# Patient Record
Sex: Female | Born: 1970 | Race: White | Hispanic: No | Smoking: Never smoker
Health system: Southern US, Community
[De-identification: ages and names within clinical notes are randomized; demographics above are authoritative.]

## PROBLEM LIST (undated history)

## (undated) DIAGNOSIS — N200 Calculus of kidney: Secondary | ICD-10-CM

---

## 1998-03-11 ENCOUNTER — Inpatient Hospital Stay (HOSPITAL_COMMUNITY): Admission: AD | Admit: 1998-03-11 | Discharge: 1998-03-11 | Payer: Self-pay | Admitting: *Deleted

## 1998-04-02 ENCOUNTER — Inpatient Hospital Stay (HOSPITAL_COMMUNITY): Admission: AD | Admit: 1998-04-02 | Discharge: 1998-04-02 | Payer: Self-pay | Admitting: Obstetrics and Gynecology

## 1998-04-09 ENCOUNTER — Encounter: Admission: RE | Admit: 1998-04-09 | Discharge: 1998-07-08 | Payer: Self-pay | Admitting: Family Medicine

## 1998-04-13 ENCOUNTER — Ambulatory Visit (HOSPITAL_COMMUNITY): Admission: RE | Admit: 1998-04-13 | Discharge: 1998-04-13 | Payer: Self-pay | Admitting: Obstetrics and Gynecology

## 1998-04-18 ENCOUNTER — Inpatient Hospital Stay (HOSPITAL_COMMUNITY): Admission: AD | Admit: 1998-04-18 | Discharge: 1998-04-18 | Payer: Self-pay | Admitting: Obstetrics and Gynecology

## 1998-04-19 ENCOUNTER — Inpatient Hospital Stay (HOSPITAL_COMMUNITY): Admission: AD | Admit: 1998-04-19 | Discharge: 1998-04-22 | Payer: Self-pay | Admitting: Obstetrics and Gynecology

## 1998-04-23 ENCOUNTER — Observation Stay (HOSPITAL_COMMUNITY): Admission: AD | Admit: 1998-04-23 | Discharge: 1998-04-24 | Payer: Self-pay | Admitting: Obstetrics and Gynecology

## 1998-05-02 ENCOUNTER — Inpatient Hospital Stay (HOSPITAL_COMMUNITY): Admission: AD | Admit: 1998-05-02 | Discharge: 1998-05-02 | Payer: Self-pay | Admitting: Obstetrics and Gynecology

## 1998-05-03 ENCOUNTER — Ambulatory Visit (HOSPITAL_COMMUNITY): Admission: RE | Admit: 1998-05-03 | Discharge: 1998-05-03 | Payer: Self-pay | Admitting: Obstetrics and Gynecology

## 1998-05-06 ENCOUNTER — Inpatient Hospital Stay (HOSPITAL_COMMUNITY): Admission: AD | Admit: 1998-05-06 | Discharge: 1998-05-09 | Payer: Self-pay | Admitting: Obstetrics and Gynecology

## 1998-05-11 ENCOUNTER — Inpatient Hospital Stay (HOSPITAL_COMMUNITY): Admission: AD | Admit: 1998-05-11 | Discharge: 1998-05-11 | Payer: Self-pay | Admitting: Obstetrics and Gynecology

## 1998-06-13 ENCOUNTER — Other Ambulatory Visit: Admission: RE | Admit: 1998-06-13 | Discharge: 1998-06-13 | Payer: Self-pay | Admitting: Obstetrics and Gynecology

## 1998-07-16 ENCOUNTER — Inpatient Hospital Stay (HOSPITAL_COMMUNITY): Admission: RE | Admit: 1998-07-16 | Discharge: 1998-07-20 | Payer: Self-pay | Admitting: Obstetrics and Gynecology

## 1998-07-20 ENCOUNTER — Inpatient Hospital Stay (HOSPITAL_COMMUNITY): Admission: AD | Admit: 1998-07-20 | Discharge: 1998-07-20 | Payer: Self-pay | Admitting: Obstetrics and Gynecology

## 1999-09-12 ENCOUNTER — Other Ambulatory Visit: Admission: RE | Admit: 1999-09-12 | Discharge: 1999-09-12 | Payer: Self-pay | Admitting: Family Medicine

## 2000-12-18 ENCOUNTER — Ambulatory Visit (HOSPITAL_COMMUNITY): Admission: RE | Admit: 2000-12-18 | Discharge: 2000-12-18 | Payer: Self-pay | Admitting: Gastroenterology

## 2001-12-22 HISTORY — PX: ABDOMINAL HYSTERECTOMY: SHX81

## 2002-09-01 ENCOUNTER — Encounter: Admission: RE | Admit: 2002-09-01 | Discharge: 2002-11-30 | Payer: Self-pay | Admitting: Family Medicine

## 2002-09-29 ENCOUNTER — Encounter: Admission: RE | Admit: 2002-09-29 | Discharge: 2002-09-29 | Payer: Self-pay | Admitting: Neurology

## 2002-09-29 ENCOUNTER — Encounter: Payer: Self-pay | Admitting: Neurology

## 2003-02-23 ENCOUNTER — Emergency Department (HOSPITAL_COMMUNITY): Admission: EM | Admit: 2003-02-23 | Discharge: 2003-02-23 | Payer: Self-pay | Admitting: Emergency Medicine

## 2003-02-23 ENCOUNTER — Encounter: Payer: Self-pay | Admitting: Emergency Medicine

## 2004-03-26 ENCOUNTER — Other Ambulatory Visit: Admission: RE | Admit: 2004-03-26 | Discharge: 2004-03-26 | Payer: Self-pay | Admitting: Family Medicine

## 2007-08-10 ENCOUNTER — Emergency Department (HOSPITAL_COMMUNITY): Admission: EM | Admit: 2007-08-10 | Discharge: 2007-08-11 | Payer: Self-pay | Admitting: Emergency Medicine

## 2010-08-24 ENCOUNTER — Emergency Department (HOSPITAL_COMMUNITY): Admission: EM | Admit: 2010-08-24 | Discharge: 2010-08-24 | Payer: Self-pay | Admitting: Emergency Medicine

## 2011-03-06 LAB — POCT I-STAT, CHEM 8
BUN: 12 mg/dL (ref 6–23)
Calcium, Ion: 1.13 mmol/L (ref 1.12–1.32)
Chloride: 106 mEq/L (ref 96–112)
Glucose, Bld: 115 mg/dL — ABNORMAL HIGH (ref 70–99)
TCO2: 25 mmol/L (ref 0–100)

## 2011-03-06 LAB — POCT PREGNANCY, URINE: Preg Test, Ur: NEGATIVE

## 2011-05-09 NOTE — Procedures (Signed)
[facility]  Patient:    Nichole Figueroa, Nichole Figueroa                       MRN: [id number] Proc. Date: 12/18/00 Adm. Date:  [id number] Attending:  Orland Mustard CC:         Meredith Staggers, M.D.   Procedure Report  DATE OF BIRTH:  July 25, 1971  PROCEDURE:  Esophagogastroduodenoscopy.  MEDICATIONS:  Cetacaine spray, fentanyl 60 mcg, Versed 5 mg IV.  INDICATIONS:  Patient has a history of esophageal reflux.  Has been on a multitude of medications including proton-pump inhibitors and H2 blockers, which have failed to help.  For this reason, an endoscopy is performed.  DESCRIPTION OF PROCEDURE:  The procedure being explained to the patient, consent obtained.  The patient in left lateral decubitus position.  The Olympus endoscope was inserted blindly into the esophagus and advanced under visualization. The distal esophagus was reached.  There was what appeared to be a Schatzki ring and inflamed distal esophagus with a small ulceration at the tip of the ring, widely patent GE junction with free reflux.  There was a 3-4 cm hiatal hernia.  The stomach was entered, pylorus identified and passed. The duodenum, including the bulb and second portion were seen well and was unremarkable.  Scope withdrawn back into the stomach.  Pyloric channel was normal.  The antrum and body were seen well and were normal.  Fundus and cardia seen on retroflexed view and were normal.  The scope was withdrawn. The patient tolerated procedure well and was maintained on low-flow oxygen and pulse oximeter throughout the procedure with no obvious problem.  ASSESSMENT:  Gastroesophageal reflux disease with small ulceration.  PLAN:  Will give a combination of Nexium , Reglan and will give reflux instructions.  Will see back in the office in 2-3 weeks. DD:  12/18/00 TD:  12/18/00 Job: [id number]

## 2011-10-03 LAB — CBC
HCT: 36.7
Hemoglobin: 12.3
MCHC: 33.6
MCV: 85
RBC: 4.32
WBC: 9.2

## 2011-10-03 LAB — DIFFERENTIAL
Basophils Relative: 1
Eosinophils Absolute: 0.1
Eosinophils Relative: 1
Lymphs Abs: 2.4
Monocytes Absolute: 0.6
Monocytes Relative: 7
Neutrophils Relative %: 65

## 2011-10-03 LAB — I-STAT 8, (EC8 V) (CONVERTED LAB)
Acid-base deficit: 2
BUN: 10
Chloride: 106
Glucose, Bld: 91
Potassium: 3.7
pCO2, Ven: 44.2 — ABNORMAL LOW
pH, Ven: 7.342 — ABNORMAL HIGH

## 2011-10-03 LAB — POCT I-STAT CREATININE: Creatinine, Ser: 1

## 2016-07-25 ENCOUNTER — Inpatient Hospital Stay (HOSPITAL_COMMUNITY)
Admission: EM | Admit: 2016-07-25 | Discharge: 2016-07-29 | DRG: 700 | Disposition: A | Discharge status: Home / Self Care | Payer: Self-pay | Attending: Urology | Admitting: Urology

## 2016-07-25 ENCOUNTER — Emergency Department (HOSPITAL_COMMUNITY): Payer: Self-pay

## 2016-07-25 ENCOUNTER — Encounter (HOSPITAL_COMMUNITY): Payer: Self-pay | Admitting: Emergency Medicine

## 2016-07-25 DIAGNOSIS — Z87442 Personal history of urinary calculi: Secondary | ICD-10-CM

## 2016-07-25 DIAGNOSIS — R3129 Other microscopic hematuria: Secondary | ICD-10-CM | POA: Diagnosis present

## 2016-07-25 DIAGNOSIS — N390 Urinary tract infection, site not specified: Secondary | ICD-10-CM

## 2016-07-25 DIAGNOSIS — Z885 Allergy status to narcotic agent status: Secondary | ICD-10-CM

## 2016-07-25 DIAGNOSIS — N2889 Other specified disorders of kidney and ureter: Principal | ICD-10-CM | POA: Diagnosis present

## 2016-07-25 DIAGNOSIS — N281 Cyst of kidney, acquired: Secondary | ICD-10-CM | POA: Diagnosis present

## 2016-07-25 DIAGNOSIS — R109 Unspecified abdominal pain: Secondary | ICD-10-CM

## 2016-07-25 HISTORY — DX: Calculus of kidney: N20.0

## 2016-07-25 LAB — COMPREHENSIVE METABOLIC PANEL
ALK PHOS: 93 U/L (ref 38–126)
ALT: 14 U/L (ref 14–54)
AST: 18 U/L (ref 15–41)
Albumin: 4.1 g/dL (ref 3.5–5.0)
Anion gap: 8 (ref 5–15)
BUN: 16 mg/dL (ref 6–20)
CHLORIDE: 107 mmol/L (ref 101–111)
CO2: 24 mmol/L (ref 22–32)
CREATININE: 0.95 mg/dL (ref 0.44–1.00)
Calcium: 9.2 mg/dL (ref 8.9–10.3)
GFR calc Af Amer: >60 mL/min (ref >60)
GFR calc non Af Amer: >60 mL/min (ref >60)
GLUCOSE: 125 mg/dL — AB (ref 65–99)
Potassium: 3.7 mmol/L (ref 3.5–5.1)
SODIUM: 139 mmol/L (ref 135–145)
Total Bilirubin: 0.7 mg/dL (ref 0.3–1.2)
Total Protein: 8.4 g/dL — ABNORMAL HIGH (ref 6.5–8.1)

## 2016-07-25 LAB — CBC
HCT: 33.6 % — ABNORMAL LOW (ref 36.0–46.0)
Hemoglobin: 10.7 g/dL — ABNORMAL LOW (ref 12.0–15.0)
MCH: 26.3 pg (ref 26.0–34.0)
MCHC: 31.8 g/dL (ref 30.0–36.0)
MCV: 82.6 fL (ref 78.0–100.0)
PLATELETS: 342 10*3/uL (ref 150–400)
RBC: 4.07 MIL/uL (ref 3.87–5.11)
RDW: 15 % (ref 11.5–15.5)
WBC: 9.2 10*3/uL (ref 4.0–10.5)

## 2016-07-25 LAB — URINALYSIS, ROUTINE W REFLEX MICROSCOPIC
BILIRUBIN URINE: NEGATIVE
GLUCOSE, UA: NEGATIVE mg/dL
KETONES UR: NEGATIVE mg/dL
Leukocytes, UA: NEGATIVE
Nitrite: NEGATIVE
PROTEIN: 30 mg/dL — AB
Specific Gravity, Urine: 1.024 (ref 1.005–1.030)
pH: 6 (ref 5.0–8.0)

## 2016-07-25 LAB — HEMOGLOBIN AND HEMATOCRIT, BLOOD
HCT: 32.2 % — ABNORMAL LOW (ref 36.0–46.0)
HEMOGLOBIN: 10.2 g/dL — AB (ref 12.0–15.0)

## 2016-07-25 LAB — LIPASE, BLOOD: LIPASE: 24 U/L (ref 11–51)

## 2016-07-25 LAB — URINE MICROSCOPIC-ADD ON

## 2016-07-25 LAB — I-STAT BETA HCG BLOOD, ED (MC, WL, AP ONLY): I-stat hCG, quantitative: <5 m[IU]/mL (ref <5)

## 2016-07-25 MED ORDER — HYDROMORPHONE HCL 2 MG PO TABS: 1.0000 mg | ORAL_TABLET | ORAL | 0 refills | Status: DC | PRN

## 2016-07-25 MED ORDER — DEXTROSE 5 % IV SOLN
1.0000 g | Freq: Once | INTRAVENOUS | Status: AC
  Administered 2016-07-25: 1 g via INTRAVENOUS
  Filled 2016-07-25: qty 10

## 2016-07-25 MED ORDER — ONDANSETRON HCL 4 MG/2ML IJ SOLN
4.0000 mg | INTRAMUSCULAR | Status: DC | PRN
  Administered 2016-07-25 – 2016-07-26 (×3): 4 mg via INTRAVENOUS
  Filled 2016-07-25 (×3): qty 2

## 2016-07-25 MED ORDER — HYDROMORPHONE HCL 1 MG/ML IJ SOLN
1.0000 mg | Freq: Once | INTRAMUSCULAR | Status: AC
  Administered 2016-07-25: 1 mg via INTRAVENOUS
  Filled 2016-07-25: qty 1

## 2016-07-25 MED ORDER — IOPAMIDOL (ISOVUE-300) INJECTION 61%
100.0000 mL | Freq: Once | INTRAVENOUS | Status: AC | PRN
  Administered 2016-07-25: 100 mL via INTRAVENOUS

## 2016-07-25 MED ORDER — OXYCODONE-ACETAMINOPHEN 5-325 MG PO TABS: 1.0000 | ORAL_TABLET | ORAL | Status: DC | PRN

## 2016-07-25 MED ORDER — HYDROMORPHONE HCL 1 MG/ML IJ SOLN
0.5000 mg | INTRAMUSCULAR | Status: DC | PRN
  Administered 2016-07-25 – 2016-07-28 (×17): 1 mg via INTRAVENOUS
  Filled 2016-07-25 (×17): qty 1

## 2016-07-25 MED ORDER — DIPHENHYDRAMINE HCL 25 MG PO CAPS
25.0000 mg | ORAL_CAPSULE | Freq: Once | ORAL | Status: AC
  Administered 2016-07-25: 25 mg via ORAL
  Filled 2016-07-25: qty 1

## 2016-07-25 MED ORDER — MORPHINE SULFATE (PF) 4 MG/ML IV SOLN
6.0000 mg | Freq: Once | INTRAVENOUS | Status: AC
  Administered 2016-07-25: 6 mg via INTRAVENOUS
  Filled 2016-07-25: qty 2

## 2016-07-25 MED ORDER — HYDROMORPHONE HCL 1 MG/ML IJ SOLN: 1.0000 mg | INTRAMUSCULAR | Status: DC | PRN

## 2016-07-25 MED ORDER — ONDANSETRON 4 MG PO TBDP: 4.0000 mg | ORAL_TABLET | Freq: Three times a day (TID) | ORAL | 0 refills | Status: DC | PRN

## 2016-07-25 MED ORDER — CEPHALEXIN 500 MG PO CAPS: 500.0000 mg | ORAL_CAPSULE | Freq: Two times a day (BID) | ORAL | 0 refills | Status: DC

## 2016-07-25 MED ORDER — MORPHINE SULFATE (PF) 4 MG/ML IV SOLN
4.0000 mg | Freq: Once | INTRAVENOUS | Status: AC
  Administered 2016-07-25: 4 mg via INTRAVENOUS
  Filled 2016-07-25: qty 1

## 2016-07-25 MED ORDER — KCL IN DEXTROSE-NACL 20-5-0.45 MEQ/L-%-% IV SOLN
INTRAVENOUS | Status: DC
  Administered 2016-07-25 – 2016-07-28 (×5): via INTRAVENOUS
  Filled 2016-07-25 (×9): qty 1000

## 2016-07-25 MED ORDER — ONDANSETRON HCL 4 MG/2ML IJ SOLN: 4.0000 mg | Freq: Three times a day (TID) | INTRAMUSCULAR | Status: DC | PRN

## 2016-07-25 MED ORDER — ONDANSETRON HCL 4 MG/2ML IJ SOLN
4.0000 mg | Freq: Once | INTRAMUSCULAR | Status: AC
Start: 2016-07-25 — End: 2016-07-25
  Administered 2016-07-25: 4 mg via INTRAVENOUS
  Filled 2016-07-25: qty 2

## 2016-07-25 MED ORDER — ONDANSETRON HCL 4 MG/2ML IJ SOLN
4.0000 mg | Freq: Once | INTRAMUSCULAR | Status: AC
  Administered 2016-07-25: 4 mg via INTRAVENOUS
  Filled 2016-07-25: qty 2

## 2016-07-25 NOTE — ED Triage Notes (Signed)
Pt reports L flank pain since this am. No dysuria or blood in urine. Hx of kidney stones, feels like same. No known injuries. Some emesis. No diarrhea.

## 2016-07-25 NOTE — ED Provider Notes (Signed)
[facility] DEPT Provider Note   CSN: [id number] Arrival date & time: 07/25/16  1044  First Provider Contact:  First MD Initiated Contact with Patient 07/25/16 1105        History   Chief Complaint Chief Complaint  Patient presents with  . Flank Pain    HPI Nichole Figueroa is a 45 y.o. female.  The history is provided by the patient and medical records.  Flank Pain    45 year old female with history of kidney stones, presenting to the ED for sudden onset left flank pain that began at 4 AM this morning. States pain began in her left flank and is now localized to her left groin. States she has been writhing in pain since onset. States pain does come in waves, at times more severe than others but never fully resolves. She does report some nausea and vomiting as well. She has had some difficulty urinating but denies any noted hematuria. No fever or chills. Does report history of kidney stones in the past with similar symptoms. Her prior stones were passed spontaneously. She's not had any other abdominal surgeries.  She did take goody powder this morning without any relief.  Past Medical History:  Diagnosis Date  . Kidney stone     There are no active problems to display for this patient.   History reviewed. No pertinent surgical history.  OB History    No data available       Home Medications    Prior to Admission medications   Not on File    Family History History reviewed. No pertinent family history.  Social History Social History  Substance Use Topics  . Smoking status: Never Smoker  . Smokeless tobacco: Never Used  . Alcohol use No     Allergies   Codeine; Hydrocodone; and Tramadol   Review of Systems Review of Systems  Genitourinary: Positive for flank pain.     Physical Exam Updated Vital Signs BP 137/72   Pulse 70   Temp 98.2 F (36.8 C) (Oral)   Resp 16   SpO2 98%   Physical Exam  Constitutional: She is oriented to person, place,  and time. She appears well-developed and well-nourished.  Appears uncomfortable, writing in bed  HENT:  Head: Normocephalic and atraumatic.  Mouth/Throat: Oropharynx is clear and moist.  Eyes: Conjunctivae and EOM are normal. Pupils are equal, round, and reactive to light.  Neck: Normal range of motion.  Cardiovascular: Normal rate, regular rhythm and normal heart sounds.   Pulmonary/Chest: Effort normal and breath sounds normal.  Abdominal: Soft. Bowel sounds are normal. There is CVA tenderness (left).    Left CVA tenderness with radiation into left groin  Musculoskeletal: Normal range of motion.  Neurological: She is alert and oriented to person, place, and time.  Skin: Skin is warm and dry.  Psychiatric: She has a normal mood and affect.  Nursing note and vitals reviewed.    ED Treatments / Results  Labs (all labs ordered are listed, but only abnormal results are displayed) Labs Reviewed  COMPREHENSIVE METABOLIC PANEL - Abnormal; Notable for the following:       Result Value   Glucose, Bld 125 (*)    Total Protein 8.4 (*)    All other components within normal limits  CBC - Abnormal; Notable for the following:    Hemoglobin 10.7 (*)    HCT 33.6 (*)    All other components within normal limits  URINALYSIS, ROUTINE W REFLEX MICROSCOPIC (NOT AT  [facility]) - Abnormal; Notable for the following:    APPearance CLOUDY (*)    Hgb urine dipstick MODERATE (*)    Protein, ur 30 (*)    All other components within normal limits  URINE MICROSCOPIC-ADD ON - Abnormal; Notable for the following:    Squamous Epithelial / LPF 0-5 (*)    Bacteria, UA MANY (*)    All other components within normal limits  URINE CULTURE  LIPASE, BLOOD  I-STAT BETA HCG BLOOD, ED ([facility], [facility], [facility] ONLY)    EKG  EKG Interpretation None       Radiology Ct Abdomen Pelvis W Contrast  Addendum Date: 07/25/2016   ADDENDUM REPORT: 07/25/2016 15:40 ADDENDUM: The changes in the liver could also represent a perfusion  abnormality. Electronically Signed   By: Inez Catalina M.D.   On: 07/25/2016 15:40   Result Date: 07/25/2016 CLINICAL DATA:  Left renal hemorrhage EXAM: CT ABDOMEN AND PELVIS WITH CONTRAST TECHNIQUE: Multidetector CT imaging of the abdomen and pelvis was performed using the standard protocol following bolus administration of intravenous contrast. CONTRAST:  129mL ISOVUE-300 IOPAMIDOL (ISOVUE-300) INJECTION 61% COMPARISON:  Noncontrast study from earlier in the same day FINDINGS: Lower chest: Lung bases demonstrate some mild atelectatic changes. No focal confluent infiltrate is seen. A small hiatal hernia is noted. Hepatobiliary: Gallbladder is within normal limits. The liver demonstrates multiple rounded areas of enhancement which were not well appreciated in the early arterial phase hand are not well appreciated on the delayed images. Pancreas: No mass, inflammatory changes, or other significant abnormality. Spleen: Within normal limits in size and appearance. Adrenals/Urinary Tract: The adrenal glands are within normal limits bilaterally. The right kidney is within normal limits. The left kidney again demonstrates a large masslike lesion with heterogeneous internal density. No active extravasation of contrast material is noted. Some perinephric stranding is noted inferiorly extending along the psoas muscle. The bladder is decompressed. No pelvic mass lesion is noted. The uterus has been surgically removed. Stomach/Bowel: No evidence of obstruction, inflammatory process, or abnormal fluid collections. The appendix is within normal limits. Vascular/Lymphatic: No pathologically enlarged lymph nodes. No evidence of abdominal aortic aneurysm. Reproductive: No mass or other significant abnormality. Other: None. Musculoskeletal:  No suspicious bone lesions identified. IMPRESSION: Changes are again noted in the left kidney similar to that seen on the precontrast examination. The differential is unchanged and MRI is  recommended for further evaluation. Areas of enhancement within the liver. These may represent multiple hepatic hemangiomas. This could also be evaluated on MRI. Alternatively ultrasound of the right upper quadrant may be helpful. Electronically Signed: By: Inez Catalina M.D. On: 07/25/2016 15:36   Ct Renal Stone Study  Result Date: 07/25/2016 CLINICAL DATA:  Left flank pain EXAM: CT ABDOMEN AND PELVIS WITHOUT CONTRAST TECHNIQUE: Multidetector CT imaging of the abdomen and pelvis was performed following the standard protocol without IV contrast. COMPARISON:  None. FINDINGS: Lower chest: Dependent changes are noted within both lung bases. There is no pleural effusion identified. Hepatobiliary: No suspicious liver abnormality. The gallbladder appears normal. Pancreas: Normal appearance of the pancreas. Spleen: The visualized portions of the spleen are normal. Adrenals/Urinary Tract: Normal appearance of the adrenal glands. The right kidney appears normal. There is a large heterogeneous mass involving the mid and inferior pole the left kidney which measures 8.8 by 8.9 set by 8.0 cm peer this appears well circumscribed. She dx stranding within the perinephric fat is identified. There is also fluid extending along the retroperitoneal space adjacent to the  left psoas muscle. No left-sided hydronephrosis identified. The urinary bladder appears normal. Stomach/Bowel: Small hiatal hernia identified. The small bowel loops have a normal course and caliber. The appendix is visualized and appears normal. Normal appearance of the colon. Vascular/Lymphatic: Normal appearance of the abdominal aorta. No enlarged retroperitoneal or mesenteric adenopathy. No enlarged pelvic or inguinal lymph nodes. Reproductive: Previous hysterectomy.  No adnexal mass. Other: There is no ascites or focal fluid collections within the abdomen or pelvis. Musculoskeletal:  No suspicious bone lesions identified. IMPRESSION: 1. Large heterogeneous mass  arises from the mid and inferior pole of the left kidney. This is favored to represent hemorrhage into a pre-existing cyst or hemorrhage into a benign or malignant kidney mass. Followup imaging to ensure stability/resolution. Additionally, following resolution of acute hemorrhage a contrast enhanced MRI of the kidneys may be helpful to assess for underlying solid renal mass which may have hemorrhaged. 2. Small hiatal hernia. Electronically Signed   By: Kerby Moors M.D.   On: 07/25/2016 12:55    Procedures Procedures (including critical care time)  Medications Ordered in ED Medications  HYDROmorphone (DILAUDID) injection 1 mg (1 mg Intravenous Given 07/25/16 1147)  ondansetron (ZOFRAN) injection 4 mg (4 mg Intravenous Given 07/25/16 1147)  morphine 4 MG/ML injection 6 mg (6 mg Intravenous Given 07/25/16 1251)  morphine 4 MG/ML injection 4 mg (4 mg Intravenous Given 07/25/16 1445)  iopamidol (ISOVUE-300) 61 % injection 100 mL (100 mLs Intravenous Contrast Given 07/25/16 1504)  HYDROmorphone (DILAUDID) injection 1 mg (1 mg Intravenous Given 07/25/16 1628)  diphenhydrAMINE (BENADRYL) capsule 25 mg (25 mg Oral Given 07/25/16 1732)  ondansetron (ZOFRAN) injection 4 mg (4 mg Intravenous Given 07/25/16 1756)  cefTRIAXone (ROCEPHIN) 1 g in dextrose 5 % 50 mL IVPB (1 g Intravenous New Bag/Given 07/25/16 1842)     Initial Impression / Assessment and Plan / ED Course  I have reviewed the triage vital signs and the nursing notes.  Pertinent labs & imaging results that were available during my care of the patient were reviewed by me and considered in my medical decision making (see chart for details).  Clinical Course  Value Comment By Time  CO2: 24 (Reviewed) Larene Pickett, PA-C 08/76 7065   45 year old female here with left flank pain. On exam she does appear uncomfortable, writhing in the bed. History of kidney stones in the past with similar symptoms. She is afebrile and nontoxic. Labs obtained, overall  reassuring. Her hemoglobin is stable at 10.7.  Renal function preserved.  U/a appears infectious with many bacteria, will send for culture.  CT renal study obtained-- does reveal likely a large left hemorrhagic mass. Consult urology, Dr. Risa Grill-- he will review CT and call back.  1:39 PM Have spoken with Dr. Risa Grill again after he reviewed CT-- feels this is likely a mass that she has bled into, question benign vs malignant.  Feels this is unlikely to spread externally or become life threatening.  If remains pain controlled and hemodynamically stable, can be safely discharged home and they will follow-up in clinic on Monday.  Does recommend CT with contrast for further evaluation.  CT ordered, pain controlled currently.  5:14 PM Pain controlled, patient resting comfortably in bed.  I have discussed her imaging studies and labs with her at length several times and have answered all of her questions.  She understands the importance of close follow-up.  She also understands to return here for any new/worsening symptoms.  She has a noted allergy to codeine  so will have to d/c home with low dose dilaudid tablets.  Also given Rx zofran and keflex.  Urine culture pending.  FU with urology clinic on Monday.  Discussed plan with patient, he/she acknowledged understanding and agreed with plan of care.  Return precautions given for new or worsening symptoms.  5:20 PM After discharge patient began complaining of itching to RN.  Upon me entering room she is resting comfortably but begins scratching her abdomen when I approach her.  She has no apparent bodily rash or any signs of allergic reaction near her IV.  He oropharynx is clear.  Will give dose of benadryl.    6:12 PM Patient now vomiting.  States she does not feel comfortable going home at this time.  She has required multiple doses of pain medication here in the ED. Will consult urology again for possible observation admission.  6:34 PM Spoke with Dr. Risa Grill  again-- going into emergency surgery currently.  I have placed temp admit orders for med-surg obs bed.  He will see her when he finishes his case.  Given dose of Rocephin pending urine culture.  Final Clinical Impressions(s) / ED Diagnoses   Final diagnoses:  Renal hemorrhage, left  Left flank pain  UTI (lower urinary tract infection)    New Prescriptions Current Discharge Medication List       Larene Pickett, PA-C 07/25/16 1936    Dorie Rank, MD 07/29/16 [phone]

## 2016-07-25 NOTE — ED Notes (Signed)
Patient off the floor.

## 2016-07-25 NOTE — ED Notes (Signed)
Patient transported to CT 

## 2016-07-25 NOTE — ED Notes (Signed)
After dressing pt and assisting her to wheelchair, pt started to vomit and sts ,"I can't do this." EDP notified and is calling urology back to see if pt can be admitted for pain control and uncontrolled nausea. Pt put back into bed

## 2016-07-25 NOTE — Progress Notes (Signed)
Patient states that she gets heartburn at night. There are no orders for antacids. Urology MD on call was paged.

## 2016-07-25 NOTE — Discharge Instructions (Signed)
As we discussed, you do have a hemorrhage in the left kidney. This appears contained and does not appear to be life-threatening based on our urologists opinion. Recommend to avoid ASA, NSAIDs (aleve, motrin, goody's, etc) as these all can make bleeding worse. Take the prescribed medication as directed. Use caution when taking this, a Chem-18 drowsy/sleepy. Follow-up with urology clinic on Monday, they are expecting you. Return to the ED for new or worsening symptoms.

## 2016-07-25 NOTE — H&P (Signed)
Nichole Figueroa is an 45 y.o. female.   Chief Complaint: Left flank pain with nausea and vomiting HPI: 45 year old female with prior urologic history of nephrolithiasis and presented to the emergency room early this morning with sudden onset of significant left flank pain with radiation across her abdomen to her groin. She is also had nausea and vomiting. Given her history nephrolithiasis was suspected and a noncontrast CT was performed. Urinalysis did show microhematuria with some bacteria but no significant pyuria. Stone protocol CT was performed. A large heterogeneous mass involving the mid and inferior pole left kidney was appreciated. This mass was approximately 9 x 8 cm. The mass itself was fairly well circumscribed. This appeared consistent with acute hemorrhage into either a renal cyst or potential renal mass. The patient had no prior known history of renal cyst, angiomyolipoma or renal tumor. A definitive diagnosis could not be established. CT was repeated with IV contrast. There was continued evidence of a large mass with heterogeneous internal density. No evidence of extravasation. An obvious stone was not appreciated. Findings consistent with hemorrhage into a renal mass or underlying cyst. The patient has continued to have intermittent bouts of pain as well as nausea and emesis. She has been her all day in the ER physicians feel she does require at least overnight admission for observation for management of her pain, nausea and serial monitoring of her hemoglobin. Given the entirety of her situation I concur with this.  Both of her previous stones were passed spontaneously and the most recent was 15 years ago. She has had no recent abdominal imaging. She does admit to taking constant amounts of Goody powder for headache issues.  Past Medical History:  Diagnosis Date  . Kidney stone     Past Surgical History:  Procedure Laterality Date  . ABDOMINAL HYSTERECTOMY N/A 2003    History  reviewed. No pertinent family history. Social History:  reports that she has never smoked. She has never used smokeless tobacco. She reports that she does not drink alcohol. Her drug history is not on file.  Allergies:  Allergies  Allergen Reactions  . Codeine Nausea And Vomiting  . Hydrocodone Nausea And Vomiting  . Tramadol Hives     (Not in a hospital admission)  Results for orders placed or performed during the hospital encounter of 07/25/16 (from the past 48 hour(s))  Lipase, blood     Status: None   Collection Time: 07/25/16 11:24 AM  Result Value Ref Range   Lipase 24 11 - 51 U/L  Comprehensive metabolic panel     Status: Abnormal   Collection Time: 07/25/16 11:24 AM  Result Value Ref Range   Sodium 139 135 - 145 mmol/L   Potassium 3.7 3.5 - 5.1 mmol/L   Chloride 107 101 - 111 mmol/L   CO2 24 22 - 32 mmol/L   Glucose, Bld 125 (H) 65 - 99 mg/dL   BUN 16 6 - 20 mg/dL   Creatinine, Ser 0.95 0.44 - 1.00 mg/dL   Calcium 9.2 8.9 - 10.3 mg/dL   Total Protein 8.4 (H) 6.5 - 8.1 g/dL   Albumin 4.1 3.5 - 5.0 g/dL   AST 18 15 - 41 U/L   ALT 14 14 - 54 U/L   Alkaline Phosphatase 93 38 - 126 U/L   Total Bilirubin 0.7 0.3 - 1.2 mg/dL   GFR calc non Af Amer >60 >60 mL/min   GFR calc Af Amer >60 >60 mL/min    Comment: (NOTE) The  eGFR has been calculated using the CKD EPI equation. This calculation has not been validated in all clinical situations. eGFR's persistently <60 mL/min signify possible Chronic Kidney Disease.    Anion gap 8 5 - 15  CBC     Status: Abnormal   Collection Time: 07/25/16 11:24 AM  Result Value Ref Range   WBC 9.2 4.0 - 10.5 K/uL   RBC 4.07 3.87 - 5.11 MIL/uL   Hemoglobin 10.7 (L) 12.0 - 15.0 g/dL   HCT 33.6 (L) 36.0 - 46.0 %   MCV 82.6 78.0 - 100.0 fL   MCH 26.3 26.0 - 34.0 pg   MCHC 31.8 30.0 - 36.0 g/dL   RDW 15.0 11.5 - 15.5 %   Platelets 342 150 - 400 K/uL  Urinalysis, Routine w reflex microscopic     Status: Abnormal   Collection Time:  07/25/16 11:40 AM  Result Value Ref Range   Color, Urine YELLOW YELLOW   APPearance CLOUDY (A) CLEAR   Specific Gravity, Urine 1.024 1.005 - 1.030   pH 6.0 5.0 - 8.0   Glucose, UA NEGATIVE NEGATIVE mg/dL   Hgb urine dipstick MODERATE (A) NEGATIVE   Bilirubin Urine NEGATIVE NEGATIVE   Ketones, ur NEGATIVE NEGATIVE mg/dL   Protein, ur 30 (A) NEGATIVE mg/dL   Nitrite NEGATIVE NEGATIVE   Leukocytes, UA NEGATIVE NEGATIVE  Urine microscopic-add on     Status: Abnormal   Collection Time: 07/25/16 11:40 AM  Result Value Ref Range   Squamous Epithelial / LPF 0-5 (A) NONE SEEN   WBC, UA 0-5 0 - 5 WBC/hpf   RBC / HPF 6-30 0 - 5 RBC/hpf   Bacteria, UA MANY (A) NONE SEEN   Urine-Other MUCOUS PRESENT   I-Stat Beta hCG blood, ED ([facility], [facility], [facility] only)     Status: None   Collection Time: 07/25/16 11:53 AM  Result Value Ref Range   I-stat hCG, quantitative <5.0 <5 mIU/mL   Comment 3            Comment:   GEST. AGE      CONC.  (mIU/mL)   <=1 WEEK        5 - 50     2 WEEKS       50 - 500     3 WEEKS       100 - 10,000     4 WEEKS     1,000 - 30,000        FEMALE AND NON-PREGNANT FEMALE:     LESS THAN 5 mIU/mL    Ct Abdomen Pelvis W Contrast  Addendum Date: 07/25/2016   ADDENDUM REPORT: 07/25/2016 15:40 ADDENDUM: The changes in the liver could also represent a perfusion abnormality. Electronically Signed   By: Inez Catalina M.D.   On: 07/25/2016 15:40   Result Date: 07/25/2016 CLINICAL DATA:  Left renal hemorrhage EXAM: CT ABDOMEN AND PELVIS WITH CONTRAST TECHNIQUE: Multidetector CT imaging of the abdomen and pelvis was performed using the standard protocol following bolus administration of intravenous contrast. CONTRAST:  155m ISOVUE-300 IOPAMIDOL (ISOVUE-300) INJECTION 61% COMPARISON:  Noncontrast study from earlier in the same day FINDINGS: Lower chest: Lung bases demonstrate some mild atelectatic changes. No focal confluent infiltrate is seen. A small hiatal hernia is noted. Hepatobiliary: Gallbladder  is within normal limits. The liver demonstrates multiple rounded areas of enhancement which were not well appreciated in the early arterial phase hand are not well appreciated on the delayed images. Pancreas: No mass, inflammatory changes, or other  significant abnormality. Spleen: Within normal limits in size and appearance. Adrenals/Urinary Tract: The adrenal glands are within normal limits bilaterally. The right kidney is within normal limits. The left kidney again demonstrates a large masslike lesion with heterogeneous internal density. No active extravasation of contrast material is noted. Some perinephric stranding is noted inferiorly extending along the psoas muscle. The bladder is decompressed. No pelvic mass lesion is noted. The uterus has been surgically removed. Stomach/Bowel: No evidence of obstruction, inflammatory process, or abnormal fluid collections. The appendix is within normal limits. Vascular/Lymphatic: No pathologically enlarged lymph nodes. No evidence of abdominal aortic aneurysm. Reproductive: No mass or other significant abnormality. Other: None. Musculoskeletal:  No suspicious bone lesions identified. IMPRESSION: Changes are again noted in the left kidney similar to that seen on the precontrast examination. The differential is unchanged and MRI is recommended for further evaluation. Areas of enhancement within the liver. These may represent multiple hepatic hemangiomas. This could also be evaluated on MRI. Alternatively ultrasound of the right upper quadrant may be helpful. Electronically Signed: By: Inez Catalina M.D. On: 07/25/2016 15:36   Ct Renal Stone Study  Result Date: 07/25/2016 CLINICAL DATA:  Left flank pain EXAM: CT ABDOMEN AND PELVIS WITHOUT CONTRAST TECHNIQUE: Multidetector CT imaging of the abdomen and pelvis was performed following the standard protocol without IV contrast. COMPARISON:  None. FINDINGS: Lower chest: Dependent changes are noted within both lung bases. There  is no pleural effusion identified. Hepatobiliary: No suspicious liver abnormality. The gallbladder appears normal. Pancreas: Normal appearance of the pancreas. Spleen: The visualized portions of the spleen are normal. Adrenals/Urinary Tract: Normal appearance of the adrenal glands. The right kidney appears normal. There is a large heterogeneous mass involving the mid and inferior pole the left kidney which measures 8.8 by 8.9 set by 8.0 cm peer this appears well circumscribed. She dx stranding within the perinephric fat is identified. There is also fluid extending along the retroperitoneal space adjacent to the left psoas muscle. No left-sided hydronephrosis identified. The urinary bladder appears normal. Stomach/Bowel: Small hiatal hernia identified. The small bowel loops have a normal course and caliber. The appendix is visualized and appears normal. Normal appearance of the colon. Vascular/Lymphatic: Normal appearance of the abdominal aorta. No enlarged retroperitoneal or mesenteric adenopathy. No enlarged pelvic or inguinal lymph nodes. Reproductive: Previous hysterectomy.  No adnexal mass. Other: There is no ascites or focal fluid collections within the abdomen or pelvis. Musculoskeletal:  No suspicious bone lesions identified. IMPRESSION: 1. Large heterogeneous mass arises from the mid and inferior pole of the left kidney. This is favored to represent hemorrhage into a pre-existing cyst or hemorrhage into a benign or malignant kidney mass. Followup imaging to ensure stability/resolution. Additionally, following resolution of acute hemorrhage a contrast enhanced MRI of the kidneys may be helpful to assess for underlying solid renal mass which may have hemorrhaged. 2. Small hiatal hernia. Electronically Signed   By: Kerby Moors M.D.   On: 07/25/2016 12:55    Review of Systems - Negative except the above mentioned complaints within the history of present illness. She does specifically denied fever, gross  hematuria  Blood pressure 151/92, pulse (!) 50, temperature 98.3 F (36.8 C), temperature source Oral, resp. rate 14, height _0  (1.676 m), weight 88 kg (194 lb), SpO2 98 %. General appearance: alert, cooperative and no distress Neck: no adenopathy and no JVD Resp: clear to auscultation bilaterally Cardio: regular rate and rhythm GI: soft, non-tender; bowel sounds normal; no masses moderate left CVA  tenderness no flank hematoma/ecchymosis Extremities: extremities normal, atraumatic, no cyanosis or edema Skin: Skin color, texture, turgor normal. No rashes or lesions Neurologic: Grossly normal  Assessment/Plan Significant left flank pain. Patient has a hemorrhagic mass involving the left kidney. There is likely 2 of been an underlying renal cyst or renal mass that has subsequently hemorrhaged. The hemorrhages contained within the renal parenchyma. Etiology could include a complex cyst with subsequent hemorrhage versus angiomyolipoma with hemorrhage versus renal cell carcinoma with hemorrhage. The patient will be admitted for control of her pain nausea and vomiting. If stable and improved in the morning will be able to be discharged. She will require additional imaging preferably with abdominal MRI. Serial images may be necessary before a definitive etiology is determined. If significant evidence of ongoing bleeding occurs that interventional radiology intervention with embolization would likely be the treatment of choice.  Froylan Hobby S 07/25/2016, 7:05 PM

## 2016-07-26 LAB — BASIC METABOLIC PANEL
ANION GAP: 5 (ref 5–15)
BUN: 11 mg/dL (ref 6–20)
CHLORIDE: 104 mmol/L (ref 101–111)
CO2: 28 mmol/L (ref 22–32)
Calcium: 8.2 mg/dL — ABNORMAL LOW (ref 8.9–10.3)
Creatinine, Ser: 0.86 mg/dL (ref 0.44–1.00)
Glucose, Bld: 128 mg/dL — ABNORMAL HIGH (ref 65–99)
POTASSIUM: 4 mmol/L (ref 3.5–5.1)
SODIUM: 137 mmol/L (ref 135–145)

## 2016-07-26 LAB — URINE CULTURE

## 2016-07-26 LAB — HEMOGLOBIN AND HEMATOCRIT, BLOOD
HCT: 29.1 % — ABNORMAL LOW (ref 36.0–46.0)
Hemoglobin: 9.1 g/dL — ABNORMAL LOW (ref 12.0–15.0)

## 2016-07-26 LAB — CBC
HEMATOCRIT: 29.7 % — AB (ref 36.0–46.0)
Hemoglobin: 9.2 g/dL — ABNORMAL LOW (ref 12.0–15.0)
MCH: 26.1 pg (ref 26.0–34.0)
MCHC: 31 g/dL (ref 30.0–36.0)
MCV: 84.4 fL (ref 78.0–100.0)
Platelets: 274 10*3/uL (ref 150–400)
RBC: 3.52 MIL/uL — AB (ref 3.87–5.11)
RDW: 15.4 % (ref 11.5–15.5)
WBC: 8.7 10*3/uL (ref 4.0–10.5)

## 2016-07-26 MED ORDER — DIPHENHYDRAMINE HCL 25 MG PO CAPS
25.0000 mg | ORAL_CAPSULE | Freq: Three times a day (TID) | ORAL | Status: DC | PRN
  Administered 2016-07-26: 25 mg via ORAL
  Filled 2016-07-26: qty 1

## 2016-07-26 MED ORDER — ALUM & MAG HYDROXIDE-SIMETH 200-200-20 MG/5ML PO SUSP
30.0000 mL | ORAL | Status: DC | PRN
  Administered 2016-07-26: 30 mL via ORAL
  Filled 2016-07-26: qty 30

## 2016-07-26 MED ORDER — ACETAMINOPHEN 325 MG PO TABS
650.0000 mg | ORAL_TABLET | ORAL | Status: DC | PRN
  Administered 2016-07-26 – 2016-07-29 (×4): 650 mg via ORAL
  Filled 2016-07-26 (×4): qty 2

## 2016-07-26 NOTE — Progress Notes (Signed)
Subjective: Patient reports continued left flank pain and nausea that is only slightly improved. Hemoglobin decreased to 9.1 from 10.2  Objective: Vital signs in last 24 hours: Temp:  [98.2 F (36.8 C)-99.1 F (37.3 C)] 98.2 F (36.8 C) (08/05 1342) Pulse Rate:  [51-71] 71 (08/05 1342) Resp:  [14-17] 17 (08/05 1342) BP: (99-144)/(57-73) 99/57 (08/05 1342) SpO2:  [100 %] 100 % (08/05 1342) Weight:  [91.3 kg (201 lb 4.5 oz)] 91.3 kg (201 lb 4.5 oz) (08/04 2102)  Intake/Output from previous day: No intake/output data recorded. Intake/Output this shift: No intake/output data recorded.  Physical Exam:  General:alert, cooperative and appears stated age GI: soft, non tender, normal bowel sounds, no palpable masses, no organomegaly, no inguinal hernia Female genitalia: not done Resp: clear to auscultation bilaterally Extremities: extremities normal, atraumatic, no cyanosis or edema  Lab Results:  Recent Labs  07/25/16 2137 07/26/16 0526 07/26/16 1800  HGB 10.2* 9.1* 9.2*  HCT 32.2* 29.1* 29.7*   BMET  Recent Labs  07/25/16 1124 07/26/16 0526  NA 139 137  K 3.7 4.0  CL 107 104  CO2 24 28  GLUCOSE 125* 128*  BUN 16 11  CREATININE 0.95 0.86  CALCIUM 9.2 8.2*   No results for input(s): LABPT, INR in the last 72 hours. No results for input(s): LABURIN in the last 72 hours. Results for orders placed or performed during the hospital encounter of 07/25/16  Urine culture     Status: Abnormal   Collection Time: 07/25/16 11:40 AM  Result Value Ref Range Status   Specimen Description URINE, CLEAN CATCH  Final   Special Requests NONE  Final   Culture MULTIPLE SPECIES PRESENT, SUGGEST RECOLLECTION (A)  Final   Report Status 07/26/2016 FINAL  Final    Studies/Results: Ct Abdomen Pelvis W Contrast  Addendum Date: 07/25/2016   ADDENDUM REPORT: 07/25/2016 15:40 ADDENDUM: The changes in the liver could also represent a perfusion abnormality. Electronically Signed   By: Inez Catalina M.D.   On: 07/25/2016 15:40   Result Date: 07/25/2016 CLINICAL DATA:  Left renal hemorrhage EXAM: CT ABDOMEN AND PELVIS WITH CONTRAST TECHNIQUE: Multidetector CT imaging of the abdomen and pelvis was performed using the standard protocol following bolus administration of intravenous contrast. CONTRAST:  132mL ISOVUE-300 IOPAMIDOL (ISOVUE-300) INJECTION 61% COMPARISON:  Noncontrast study from earlier in the same day FINDINGS: Lower chest: Lung bases demonstrate some mild atelectatic changes. No focal confluent infiltrate is seen. A small hiatal hernia is noted. Hepatobiliary: Gallbladder is within normal limits. The liver demonstrates multiple rounded areas of enhancement which were not well appreciated in the early arterial phase hand are not well appreciated on the delayed images. Pancreas: No mass, inflammatory changes, or other significant abnormality. Spleen: Within normal limits in size and appearance. Adrenals/Urinary Tract: The adrenal glands are within normal limits bilaterally. The right kidney is within normal limits. The left kidney again demonstrates a large masslike lesion with heterogeneous internal density. No active extravasation of contrast material is noted. Some perinephric stranding is noted inferiorly extending along the psoas muscle. The bladder is decompressed. No pelvic mass lesion is noted. The uterus has been surgically removed. Stomach/Bowel: No evidence of obstruction, inflammatory process, or abnormal fluid collections. The appendix is within normal limits. Vascular/Lymphatic: No pathologically enlarged lymph nodes. No evidence of abdominal aortic aneurysm. Reproductive: No mass or other significant abnormality. Other: None. Musculoskeletal:  No suspicious bone lesions identified. IMPRESSION: Changes are again noted in the left kidney similar to that seen on  the precontrast examination. The differential is unchanged and MRI is recommended for further evaluation. Areas of  enhancement within the liver. These may represent multiple hepatic hemangiomas. This could also be evaluated on MRI. Alternatively ultrasound of the right upper quadrant may be helpful. Electronically Signed: By: Inez Catalina M.D. On: 07/25/2016 15:36   Ct Renal Stone Study  Result Date: 07/25/2016 CLINICAL DATA:  Left flank pain EXAM: CT ABDOMEN AND PELVIS WITHOUT CONTRAST TECHNIQUE: Multidetector CT imaging of the abdomen and pelvis was performed following the standard protocol without IV contrast. COMPARISON:  None. FINDINGS: Lower chest: Dependent changes are noted within both lung bases. There is no pleural effusion identified. Hepatobiliary: No suspicious liver abnormality. The gallbladder appears normal. Pancreas: Normal appearance of the pancreas. Spleen: The visualized portions of the spleen are normal. Adrenals/Urinary Tract: Normal appearance of the adrenal glands. The right kidney appears normal. There is a large heterogeneous mass involving the mid and inferior pole the left kidney which measures 8.8 by 8.9 set by 8.0 cm peer this appears well circumscribed. She dx stranding within the perinephric fat is identified. There is also fluid extending along the retroperitoneal space adjacent to the left psoas muscle. No left-sided hydronephrosis identified. The urinary bladder appears normal. Stomach/Bowel: Small hiatal hernia identified. The small bowel loops have a normal course and caliber. The appendix is visualized and appears normal. Normal appearance of the colon. Vascular/Lymphatic: Normal appearance of the abdominal aorta. No enlarged retroperitoneal or mesenteric adenopathy. No enlarged pelvic or inguinal lymph nodes. Reproductive: Previous hysterectomy.  No adnexal mass. Other: There is no ascites or focal fluid collections within the abdomen or pelvis. Musculoskeletal:  No suspicious bone lesions identified. IMPRESSION: 1. Large heterogeneous mass arises from the mid and inferior pole of the  left kidney. This is favored to represent hemorrhage into a pre-existing cyst or hemorrhage into a benign or malignant kidney mass. Followup imaging to ensure stability/resolution. Additionally, following resolution of acute hemorrhage a contrast enhanced MRI of the kidneys may be helpful to assess for underlying solid renal mass which may have hemorrhaged. 2. Small hiatal hernia. Electronically Signed   By: Kerby Moors M.D.   On: 07/25/2016 12:55    Assessment/Plan: 45yo with left hemorrhagic cyst/mass  Plan: 1. Continue to trend hemoglobin q 12 hours 2. Continue narcotic pain medication and antiemetics. 3. Discharge once hemoglobin stable and pain/nausea controlled   LOS: 0 days   Nicolette Bang 07/26/2016, 8:54 PM

## 2016-07-27 LAB — CBC
HCT: 28.5 % — ABNORMAL LOW (ref 36.0–46.0)
HEMATOCRIT: 28.4 % — AB (ref 36.0–46.0)
Hemoglobin: 8.8 g/dL — ABNORMAL LOW (ref 12.0–15.0)
Hemoglobin: 9 g/dL — ABNORMAL LOW (ref 12.0–15.0)
MCH: 25.7 pg — ABNORMAL LOW (ref 26.0–34.0)
MCH: 26.7 pg (ref 26.0–34.0)
MCHC: 31 g/dL (ref 30.0–36.0)
MCHC: 31.6 g/dL (ref 30.0–36.0)
MCV: 82.8 fL (ref 78.0–100.0)
MCV: 84.6 fL (ref 78.0–100.0)
PLATELETS: 241 10*3/uL (ref 150–400)
Platelets: 246 10*3/uL (ref 150–400)
RBC: 3.43 MIL/uL — ABNORMAL LOW (ref 3.87–5.11)
RBC: 3.37 MIL/uL — AB (ref 3.87–5.11)
RDW: 15.2 % (ref 11.5–15.5)
RDW: 15.7 % — ABNORMAL HIGH (ref 11.5–15.5)
WBC: 8.5 10*3/uL (ref 4.0–10.5)
WBC: 8.1 10*3/uL (ref 4.0–10.5)

## 2016-07-27 MED ORDER — HYDROMORPHONE HCL 2 MG PO TABS
2.0000 mg | ORAL_TABLET | ORAL | Status: DC | PRN
Start: 2016-07-27 — End: 2016-07-28
  Administered 2016-07-27 – 2016-07-28 (×3): 2 mg via ORAL
  Filled 2016-07-27 (×3): qty 1

## 2016-07-27 NOTE — Progress Notes (Signed)
  Subjective: Patient reports continued left flank pain and nausea that is improved. Hemoglobin decreased to 8.8 from 9.1  Objective: Vital signs in last 24 hours: Temp:  [98.7 F (37.1 C)-99.8 F (37.7 C)] 98.7 F (37.1 C) (08/06 1403) Pulse Rate:  [86-90] 86 (08/06 1403) Resp:  [18] 18 (08/06 1403) BP: (95-135)/(61-78) 95/61 (08/06 1403) SpO2:  [90 %-100 %] 100 % (08/06 1403)  Intake/Output from previous day: 08/05 0701 - 08/06 0700 In: 4740 [P.O.:2500; I.V.:2240] Out: 1800 [Urine:1800] Intake/Output this shift: No intake/output data recorded.  Physical Exam:  General:alert, cooperative and appears stated age GI: soft, non tender, normal bowel sounds, no palpable masses, no organomegaly, no inguinal hernia Female genitalia: not done Resp: clear to auscultation bilaterally Extremities: extremities normal, atraumatic, no cyanosis or edema  Lab Results:  Recent Labs  07/26/16 1800 07/27/16 0534 07/27/16 1802  HGB 9.2* 8.8* 9.0*  HCT 29.7* 28.4* 28.5*   BMET  Recent Labs  07/25/16 1124 07/26/16 0526  NA 139 137  K 3.7 4.0  CL 107 104  CO2 24 28  GLUCOSE 125* 128*  BUN 16 11  CREATININE 0.95 0.86  CALCIUM 9.2 8.2*   No results for input(s): LABPT, INR in the last 72 hours. No results for input(s): LABURIN in the last 72 hours. Results for orders placed or performed during the hospital encounter of 07/25/16  Urine culture     Status: Abnormal   Collection Time: 07/25/16 11:40 AM  Result Value Ref Range Status   Specimen Description URINE, CLEAN CATCH  Final   Special Requests NONE  Final   Culture MULTIPLE SPECIES PRESENT, SUGGEST RECOLLECTION (A)  Final   Report Status 07/26/2016 FINAL  Final    Studies/Results: No results found.  Assessment/Plan: 45yo with left hemorrhagic cyst/mass  Plan: 1. Continue to trend hemoglobin q 12 hours 2. Continue dilaudid PO. 3. Discharge once hemoglobin stable and pain/nausea controlled   LOS: 1 day   Nicolette Bang 07/27/2016, 8:32 PM

## 2016-07-27 NOTE — Progress Notes (Signed)
Unable to transition pt to PO Percocet b/c pt reports that she is unable to tolerate Percocet "it makes me very nauseated and vomit.

## 2016-07-28 MED ORDER — BISACODYL 10 MG RE SUPP: 10.0000 mg | Freq: Every day | RECTAL | Status: DC | PRN

## 2016-07-28 MED ORDER — HYDROMORPHONE HCL 4 MG PO TABS
4.0000 mg | ORAL_TABLET | ORAL | Status: DC | PRN
  Administered 2016-07-28 – 2016-07-29 (×5): 4 mg via ORAL
  Filled 2016-07-28 (×5): qty 1

## 2016-07-29 MED ORDER — ONDANSETRON 4 MG PO TBDP
4.0000 mg | ORAL_TABLET | Freq: Three times a day (TID) | ORAL | 0 refills | Status: DC | PRN
Start: 2016-07-29 — End: 2020-11-18

## 2016-07-29 MED ORDER — HYDROMORPHONE HCL 4 MG PO TABS: 4.0000 mg | ORAL_TABLET | ORAL | 0 refills | Status: DC | PRN

## 2016-07-29 NOTE — Progress Notes (Signed)
Chaplain provided support at bedside around upcoming MRI / Diagnostic test.    Nichole Figueroa is tearful, spoke with chaplain about upcoming MRI - understands high probability that mass on kidney is tumor.  She expresses fear and feeling of surreal.  She describes not having consistent support from spouse, whom she describes as "caring about work more than me."   She is also scheduled to take 6 classes this fall at [employer] toward her degree in criminal justice.   Feeling plans are "up in the air" until she knows results of diagnostic tests.   Chaplain provided empathic support at bedside, planning for discharge,

## 2016-07-31 ENCOUNTER — Other Ambulatory Visit: Payer: Self-pay | Admitting: Urology

## 2016-07-31 DIAGNOSIS — D3000 Benign neoplasm of unspecified kidney: Secondary | ICD-10-CM

## 2016-08-03 ENCOUNTER — Emergency Department (HOSPITAL_COMMUNITY)
Admission: EM | Admit: 2016-08-03 | Discharge: 2016-08-04 | Disposition: A | Discharge status: Home / Self Care | Payer: Self-pay | Attending: Emergency Medicine | Admitting: Emergency Medicine

## 2016-08-03 ENCOUNTER — Emergency Department (HOSPITAL_COMMUNITY): Payer: Self-pay

## 2016-08-03 ENCOUNTER — Encounter (HOSPITAL_COMMUNITY): Payer: Self-pay | Admitting: Emergency Medicine

## 2016-08-03 DIAGNOSIS — R109 Unspecified abdominal pain: Secondary | ICD-10-CM | POA: Insufficient documentation

## 2016-08-03 LAB — CBC WITH DIFFERENTIAL/PLATELET
Basophils Absolute: 0 10*3/uL (ref 0.0–0.1)
Basophils Relative: 0 %
EOS PCT: 2 %
Eosinophils Absolute: 0.1 10*3/uL (ref 0.0–0.7)
HCT: 32.2 % — ABNORMAL LOW (ref 36.0–46.0)
HEMOGLOBIN: 10.2 g/dL — AB (ref 12.0–15.0)
LYMPHS ABS: 1.8 10*3/uL (ref 0.7–4.0)
LYMPHS PCT: 27 %
MCH: 25.9 pg — AB (ref 26.0–34.0)
MCHC: 31.7 g/dL (ref 30.0–36.0)
MCV: 81.7 fL (ref 78.0–100.0)
Monocytes Absolute: 0.4 10*3/uL (ref 0.1–1.0)
Monocytes Relative: 6 %
Neutro Abs: 4.2 10*3/uL (ref 1.7–7.7)
Neutrophils Relative %: 65 %
PLATELETS: 372 10*3/uL (ref 150–400)
RBC: 3.94 MIL/uL (ref 3.87–5.11)
RDW: 14.9 % (ref 11.5–15.5)
WBC: 6.5 10*3/uL (ref 4.0–10.5)

## 2016-08-03 LAB — COMPREHENSIVE METABOLIC PANEL
ALK PHOS: 86 U/L (ref 38–126)
ALT: 14 U/L (ref 14–54)
AST: 18 U/L (ref 15–41)
Albumin: 4 g/dL (ref 3.5–5.0)
Anion gap: 9 (ref 5–15)
BILIRUBIN TOTAL: 0.6 mg/dL (ref 0.3–1.2)
BUN: 17 mg/dL (ref 6–20)
CALCIUM: 8.9 mg/dL (ref 8.9–10.3)
CO2: 24 mmol/L (ref 22–32)
CREATININE: 0.91 mg/dL (ref 0.44–1.00)
Chloride: 107 mmol/L (ref 101–111)
Glucose, Bld: 88 mg/dL (ref 65–99)
Potassium: 4 mmol/L (ref 3.5–5.1)
Sodium: 140 mmol/L (ref 135–145)
TOTAL PROTEIN: 8.2 g/dL — AB (ref 6.5–8.1)

## 2016-08-03 LAB — URINALYSIS, ROUTINE W REFLEX MICROSCOPIC
Bilirubin Urine: NEGATIVE
Glucose, UA: NEGATIVE mg/dL
Ketones, ur: NEGATIVE mg/dL
Leukocytes, UA: NEGATIVE
Nitrite: NEGATIVE
Protein, ur: NEGATIVE mg/dL
SPECIFIC GRAVITY, URINE: 1.029 (ref 1.005–1.030)
pH: 5.5 (ref 5.0–8.0)

## 2016-08-03 LAB — URINE MICROSCOPIC-ADD ON

## 2016-08-03 LAB — PROTIME-INR
INR: 1.15
PROTHROMBIN TIME: 14.7 s (ref 11.4–15.2)

## 2016-08-03 MED ORDER — SODIUM CHLORIDE 0.9 % IV BOLUS (SEPSIS)
1000.0000 mL | Freq: Once | INTRAVENOUS | Status: AC
  Administered 2016-08-03: 1000 mL via INTRAVENOUS

## 2016-08-03 MED ORDER — MORPHINE SULFATE (PF) 4 MG/ML IV SOLN
4.0000 mg | Freq: Once | INTRAVENOUS | Status: AC
  Administered 2016-08-03: 4 mg via INTRAVENOUS
  Filled 2016-08-03: qty 1

## 2016-08-03 MED ORDER — ONDANSETRON 4 MG PO TBDP
4.0000 mg | ORAL_TABLET | Freq: Once | ORAL | Status: AC | PRN
  Administered 2016-08-03: 4 mg via ORAL
  Filled 2016-08-03: qty 1

## 2016-08-03 MED ORDER — IOPAMIDOL (ISOVUE-300) INJECTION 61%
100.0000 mL | Freq: Once | INTRAVENOUS | Status: AC | PRN
  Administered 2016-08-03: 100 mL via INTRAVENOUS

## 2016-08-03 NOTE — ED Notes (Signed)
Patient offered intranasal fentanyl for pain. Patient refused stating "I don't want to be loopy when I talk to my husband".

## 2016-08-03 NOTE — ED Provider Notes (Signed)
[facility] DEPT Provider Note   CSN: [id number] Arrival date & time: 08/03/16  1543  First Provider Contact:  First MD Initiated Contact with Patient 08/03/16 2018        History   Chief Complaint Chief Complaint  Patient presents with  . Flank Pain    HPI Nichole Figueroa is a 45 y.o. female.  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 The patient was just recently hospitalized due to hemorrhagic kidney mass which is thought renal cell carcinoma. She was discharged on Dilaudid, which she ran out of yesterday. She states that over the last 24 hours she has had progressively worsening aching, gnawing left flank pain. The pain now radiates towards her right flank. Denies any dysuria or gross hematuria. Denies any fevers or chills. Denies any nausea or vomiting. Her pain is similar to her previous pain, but worse. Denies any other trauma. Denies any alleviating or aggravating factors  Past Medical History:  Diagnosis Date  . Kidney stone     Patient Active Problem List   Diagnosis Date Noted  . Hemorrhage of left kidney 07/25/2016  . Renal hemorrhage, left 07/25/2016    Past Surgical History:  Procedure Laterality Date  . ABDOMINAL HYSTERECTOMY N/A 2003    OB History    No data available       Home Medications    Prior to Admission medications   Medication Sig Start Date End Date Taking? Authorizing Provider  HYDROmorphone (DILAUDID) 4 MG tablet Take 1 tablet (4 mg total) by mouth every 4 (four) hours as needed for moderate pain or severe pain. 07/29/16  Yes Cleon Gustin, MD  omeprazole (PRILOSEC) 20 MG capsule Take 20-40 mg by mouth at bedtime as needed (heartburn).    Yes Historical Provider, MD  ondansetron (ZOFRAN ODT) 4 MG disintegrating tablet Take 1 tablet (4 mg total) by mouth every 8 (eight) hours as needed for nausea or vomiting.  07/29/16  Yes Cleon Gustin, MD  HYDROmorphone (DILAUDID) 4 MG tablet Take 1 tablet (4 mg total) by mouth every 6 (six) hours as needed for severe pain. 08/04/16   Duffy Bruce, MD    Family History No family history on file.  Social History Social History  Substance Use Topics  . Smoking status: Never Smoker  . Smokeless tobacco: Never Used  . Alcohol use No     Allergies   Codeine; Hydrocodone; Percocet [oxycodone-acetaminophen]; and Tramadol   Review of Systems Review of Systems  Constitutional: Negative for chills, fatigue and fever.  HENT: Negative for congestion and rhinorrhea.   Eyes: Negative for visual disturbance.  Respiratory: Negative for cough, shortness of breath and wheezing.   Cardiovascular: Negative for chest pain and leg swelling.  Gastrointestinal: Positive for abdominal pain. Negative for diarrhea, nausea and vomiting.  Genitourinary: Positive for flank pain. Negative for dysuria.  Musculoskeletal: Negative for neck pain and neck stiffness.  Skin: Negative for rash and wound.  Allergic/Immunologic: Negative for immunocompromised state.  Neurological: Negative for syncope, weakness and headaches.     Physical Exam Updated Vital Signs BP 120/77   Pulse 76   Temp 97.7 F (36.5 C) (Oral)   Resp 17   LMP  (LMP Unknown) Comment: HCG neg  SpO2 99%   Physical Exam  Constitutional: She is oriented to person, place, and time. She appears well-developed and well-nourished. No distress.  HENT:  Head: Normocephalic and atraumatic.  Mouth/Throat: Oropharynx is clear and moist.  Eyes: Conjunctivae are normal. Pupils are equal, round, and reactive to light.  Neck: Neck supple.  Cardiovascular: Normal rate, regular rhythm and normal heart sounds.  Exam reveals no friction rub.   No murmur heard. Pulmonary/Chest: Effort normal and breath sounds normal. No respiratory distress. She has no wheezes. She has no rales.  Abdominal: Soft. Bowel sounds are  normal. She exhibits no distension. There is tenderness (Mild bilateral flank pain). There is no rebound and no guarding.  Musculoskeletal: She exhibits no edema.  Neurological: She is alert and oriented to person, place, and time. She exhibits normal muscle tone.  Skin: Skin is warm. Capillary refill takes less than 2 seconds.  Nursing note and vitals reviewed.    ED Treatments / Results  Labs (all labs ordered are listed, but only abnormal results are displayed) Labs Reviewed  URINALYSIS, ROUTINE W REFLEX MICROSCOPIC (NOT AT [facility]) - Abnormal; Notable for the following:       Result Value   APPearance CLOUDY (*)    Hgb urine dipstick MODERATE (*)    All other components within normal limits  CBC WITH DIFFERENTIAL/PLATELET - Abnormal; Notable for the following:    Hemoglobin 10.2 (*)    HCT 32.2 (*)    MCH 25.9 (*)    All other components within normal limits  COMPREHENSIVE METABOLIC PANEL - Abnormal; Notable for the following:    Total Protein 8.2 (*)    All other components within normal limits  URINE MICROSCOPIC-ADD ON - Abnormal; Notable for the following:    Squamous Epithelial / LPF 6-30 (*)    Bacteria, UA MANY (*)    All other components within normal limits  PROTIME-INR    EKG  EKG Interpretation None       Radiology Ct Abdomen Pelvis W Contrast  Result Date: 08/03/2016 CLINICAL DATA:  Acute onset of bilateral flank pain, worse on the right. Initial encounter. EXAM: CT ABDOMEN AND PELVIS WITH CONTRAST TECHNIQUE: Multidetector CT imaging of the abdomen and pelvis was performed using the standard protocol following bolus administration of intravenous contrast. CONTRAST:  162mL ISOVUE-300 IOPAMIDOL (ISOVUE-300) INJECTION 61% COMPARISON:  CT of the abdomen and pelvis performed 07/25/2016 FINDINGS: The visualized lung bases are clear. Previously noted scattered lesions within the liver are not well characterized on this study due to the phase of contrast enhancement. The  spleen is unremarkable. The gallbladder is within normal limits. The pancreas and adrenal glands are unremarkable. There is a large 9.0 x 8.7 x 9.0 cm heterogeneous mass at the anterior aspect of the left kidney, reflecting prior hemorrhage into a large renal mass. The remainder of the left kidney is grossly unremarkable. The right kidney is unremarkable in appearance. There is no evidence of hydronephrosis. No renal or ureteral stones are identified. No perinephric stranding is seen. Previously noted hemorrhage tracking about the left kidney has resolved. No free fluid is identified. The small bowel is unremarkable in appearance. The stomach is within normal limits. No acute vascular abnormalities are seen. The appendix is normal in caliber, without evidence of appendicitis. The colon is partially filled with stool and is unremarkable in appearance. The bladder is mildly distended and grossly unremarkable. The patient is status post hysterectomy. No suspicious adnexal masses are seen. No inguinal lymphadenopathy is seen. No acute osseous abnormalities are identified. IMPRESSION: 1. Large heterogeneous 9.0 x 8.7 x 9.0 cm mass at the anterior aspect of the left kidney,  reflecting prior hemorrhage into a large renal mass. Its appearance raises concern for renal cell carcinoma. As before, MRI is recommended for further evaluation. 2. Previously noted hemorrhage tracking about the left kidney has resolved. 3. Otherwise unremarkable contrast-enhanced CT of the abdomen and pelvis. These results were called by telephone at the time of interpretation on 08/03/2016 at 11:29 pm to Dr. Duffy Bruce, who verbally acknowledged these results. Electronically Signed   By: Garald Balding M.D.   On: 08/03/2016 23:33    Procedures Procedures (including critical care time)  Medications Ordered in ED Medications  ondansetron (ZOFRAN-ODT) disintegrating tablet 4 mg (4 mg Oral Given 08/03/16 1615)  sodium chloride 0.9 % bolus  1,000 mL (0 mLs Intravenous Stopped 08/03/16 2200)  morphine 4 MG/ML injection 4 mg (4 mg Intravenous Given 08/03/16 2238)  iopamidol (ISOVUE-300) 61 % injection 100 mL (100 mLs Intravenous Contrast Given 08/03/16 2241)  HYDROmorphone (DILAUDID) tablet 4 mg (4 mg Oral Given 08/04/16 0037)     Initial Impression / Assessment and Plan / ED Course  I have reviewed the triage vital signs and the nursing notes.  Pertinent labs & imaging results that were available during my care of the patient were reviewed by me and considered in my medical decision making (see chart for details).  Clinical Course    45 year old female with past medical history of recently diagnosed left renal mass who presents with acute on chronic left flank pain. No fevers or chills. On arrival, vital signs are stable and within normal limits. Examination is as above. Labwork reviewed and is overall reassuring. CBC shows stable hemoglobin, which is reassuring, as the patient did have recent hemorrhage of this mass during recent admission. Urine does not appear infected and she has no fevers or signs of pyelonephritis. Labwork is otherwise reassuring with normal, baseline renal function. Will obtain CT scan.  CT scan shows stable renal cell carcinoma. There is no new hemorrhage or expansion of the mass. I discussed the case with Dr. Noah Delaine, patient's Urologist. I suspect this is acute on chronic flank pain due to known mass. Will give additional by mouth analgesics at home and refer her to urology for follow-up. Patient is in agreement with this plan. She is otherwise hemodynamically stable.  Final Clinical Impressions(s) / ED Diagnoses   Final diagnoses:  Left flank pain    New Prescriptions New Prescriptions   HYDROMORPHONE (DILAUDID) 4 MG TABLET    Take 1 tablet (4 mg total) by mouth every 6 (six) hours as needed for severe pain.     Duffy Bruce, MD 08/04/16 [phone]

## 2016-08-03 NOTE — ED Triage Notes (Signed)
Patient presents for right flank pain starting last night, N/V. Denies fever or urinary symptoms.

## 2016-08-04 MED ORDER — HYDROMORPHONE HCL 4 MG PO TABS: 4.0000 mg | ORAL_TABLET | Freq: Four times a day (QID) | ORAL | 0 refills | Status: DC | PRN

## 2016-08-04 MED ORDER — HYDROMORPHONE HCL 2 MG PO TABS
4.0000 mg | ORAL_TABLET | Freq: Once | ORAL | Status: AC
  Administered 2016-08-04: 4 mg via ORAL
  Filled 2016-08-04: qty 2

## 2016-08-07 NOTE — Discharge Summary (Signed)
Physician Discharge Summary  Patient ID: Nichole Figueroa MRN: [id number] DOB/AGE: 1971/06/09 45 y.o.  Admit date: 07/25/2016 Discharge date: 07/28/2016 Admission Diagnoses: Left renal hemorrhage  Discharge Diagnoses:  Active Problems:   Hemorrhage of left kidney   Renal hemorrhage, left   Discharged Condition: good  Hospital Course: The patient was admitted for severe left flank pain, nausea, and retroperitoneal bleed. She had serial hemoglobins and her hemoglobin was stable at 9.0 by hospital day 3. Her pain was controlled on PO dilaudid and nausea was controlled on zofran PO. Prior to discharge the pt was tolerating a regular diet, pain was controlled on PO pain meds, they were ambulating without difficulty, and they had normal bowel function.   Consults: None  Significant Diagnostic Studies: labs: hemoglobin 9.0  Treatments: IV hydration and analgesia: Dilaudid  Discharge Exam: Blood pressure (!) 93/49, pulse 72, temperature 98.8 F (37.1 C), temperature source Oral, resp. rate 16, height 5\' 6"  (1.676 m), weight 91.3 kg (201 lb 4.5 oz), SpO2 99 %. General appearance: alert, cooperative and appears stated age Head: Normocephalic, without obvious abnormality, atraumatic Nose: Nares normal. Septum midline. Mucosa normal. No drainage or sinus tenderness. Resp: clear to auscultation bilaterally Cardio: regular rate and rhythm, S1, S2 normal, no murmur, click, rub or gallop GI: soft, non-tender; bowel sounds normal; no masses,  no organomegaly Extremities: extremities normal, atraumatic, no cyanosis or edema Neurologic: Grossly normal  Disposition: 01-Home or Self Care     Medication List    STOP taking these medications   GOODY HEADACHE PO     TAKE these medications   HYDROmorphone 4 MG tablet Commonly known as:  DILAUDID Take 1 tablet (4 mg total) by mouth every 4 (four) hours as needed for moderate pain or severe pain.   omeprazole 20 MG capsule Commonly known as:   PRILOSEC Take 20-40 mg by mouth at bedtime as needed (heartburn).   ondansetron 4 MG disintegrating tablet Commonly known as:  ZOFRAN ODT Take 1 tablet (4 mg total) by mouth every 8 (eight) hours as needed for nausea or vomiting.      Follow-up Information    Schedule an appointment as soon as possible for a visit today with [facility].   Why:  they will see you Monday in clinic. Contact information: [facility]       Nicolette Bang, MD. Call in 2 week(s).   Specialty:  Urology Why:  With [facility] Contact information: [place] [address] [phone]           Signed: Nicolette Bang 08/07/2016, 1:13 PM

## 2016-08-09 ENCOUNTER — Ambulatory Visit (HOSPITAL_COMMUNITY)
Admission: RE | Admit: 2016-08-09 | Discharge: 2016-08-09 | Disposition: A | Discharge status: Home / Self Care | Payer: Self-pay | Source: Ambulatory Visit | Attending: Urology | Admitting: Urology

## 2016-08-09 DIAGNOSIS — D3000 Benign neoplasm of unspecified kidney: Secondary | ICD-10-CM

## 2016-08-09 DIAGNOSIS — K449 Diaphragmatic hernia without obstruction or gangrene: Secondary | ICD-10-CM | POA: Insufficient documentation

## 2016-08-09 DIAGNOSIS — R932 Abnormal findings on diagnostic imaging of liver and biliary tract: Secondary | ICD-10-CM | POA: Insufficient documentation

## 2016-08-09 DIAGNOSIS — N2889 Other specified disorders of kidney and ureter: Secondary | ICD-10-CM | POA: Insufficient documentation

## 2016-08-09 MED ORDER — GADOBENATE DIMEGLUMINE 529 MG/ML IV SOLN
18.0000 mL | Freq: Once | INTRAVENOUS | Status: AC | PRN
  Administered 2016-08-09: 18 mL via INTRAVENOUS

## 2016-08-13 ENCOUNTER — Ambulatory Visit (INDEPENDENT_AMBULATORY_CARE_PROVIDER_SITE_OTHER): Payer: Self-pay | Admitting: Urology

## 2016-08-13 DIAGNOSIS — D49512 Neoplasm of unspecified behavior of left kidney: Secondary | ICD-10-CM

## 2016-09-03 ENCOUNTER — Ambulatory Visit (INDEPENDENT_AMBULATORY_CARE_PROVIDER_SITE_OTHER): Payer: Self-pay | Admitting: Urology

## 2016-09-03 ENCOUNTER — Other Ambulatory Visit: Payer: Self-pay | Admitting: Urology

## 2016-09-03 DIAGNOSIS — D49512 Neoplasm of unspecified behavior of left kidney: Secondary | ICD-10-CM

## 2016-09-03 DIAGNOSIS — M5489 Other dorsalgia: Secondary | ICD-10-CM

## 2016-09-03 DIAGNOSIS — D4102 Neoplasm of uncertain behavior of left kidney: Secondary | ICD-10-CM

## 2016-09-04 ENCOUNTER — Ambulatory Visit (HOSPITAL_COMMUNITY)
Admission: RE | Admit: 2016-09-04 | Discharge: 2016-09-04 | Disposition: A | Discharge status: Home / Self Care | Payer: Self-pay | Source: Ambulatory Visit | Attending: Urology | Admitting: Urology

## 2016-09-04 ENCOUNTER — Ambulatory Visit (HOSPITAL_COMMUNITY): Payer: Self-pay

## 2016-09-04 DIAGNOSIS — D49512 Neoplasm of unspecified behavior of left kidney: Secondary | ICD-10-CM | POA: Insufficient documentation

## 2016-09-04 DIAGNOSIS — M5489 Other dorsalgia: Secondary | ICD-10-CM | POA: Insufficient documentation

## 2016-09-04 DIAGNOSIS — N1330 Unspecified hydronephrosis: Secondary | ICD-10-CM | POA: Insufficient documentation

## 2016-09-04 DIAGNOSIS — N281 Cyst of kidney, acquired: Secondary | ICD-10-CM | POA: Insufficient documentation

## 2016-09-04 DIAGNOSIS — D4102 Neoplasm of uncertain behavior of left kidney: Secondary | ICD-10-CM

## 2016-09-17 ENCOUNTER — Ambulatory Visit: Payer: Self-pay | Admitting: Urology

## 2016-10-08 ENCOUNTER — Ambulatory Visit: Payer: Self-pay | Admitting: Urology

## 2016-10-14 ENCOUNTER — Other Ambulatory Visit: Payer: Self-pay | Admitting: Urology

## 2016-10-14 DIAGNOSIS — D3000 Benign neoplasm of unspecified kidney: Secondary | ICD-10-CM

## 2016-10-24 ENCOUNTER — Ambulatory Visit (HOSPITAL_COMMUNITY)
Admission: RE | Admit: 2016-10-24 | Discharge: 2016-10-24 | Disposition: A | Discharge status: Home / Self Care | Payer: Self-pay | Source: Ambulatory Visit | Attending: Urology | Admitting: Urology

## 2016-10-24 DIAGNOSIS — N289 Disorder of kidney and ureter, unspecified: Secondary | ICD-10-CM | POA: Insufficient documentation

## 2016-10-24 DIAGNOSIS — D3000 Benign neoplasm of unspecified kidney: Secondary | ICD-10-CM

## 2016-10-24 MED ORDER — GADOXETATE DISODIUM 0.25 MOL/L IV SOLN
10.0000 mL | Freq: Once | INTRAVENOUS | Status: AC | PRN
Start: 2016-10-24 — End: 2016-10-24
  Administered 2016-10-24: 8 mL via INTRAVENOUS

## 2016-10-29 ENCOUNTER — Ambulatory Visit (INDEPENDENT_AMBULATORY_CARE_PROVIDER_SITE_OTHER): Payer: Self-pay | Admitting: Urology

## 2016-10-29 DIAGNOSIS — M5489 Other dorsalgia: Secondary | ICD-10-CM

## 2017-04-10 ENCOUNTER — Other Ambulatory Visit: Payer: Self-pay | Admitting: Urology

## 2017-04-10 DIAGNOSIS — D49512 Neoplasm of unspecified behavior of left kidney: Secondary | ICD-10-CM

## 2017-05-07 ENCOUNTER — Ambulatory Visit (HOSPITAL_COMMUNITY): Admission: RE | Admit: 2017-05-07 | Payer: Self-pay | Source: Ambulatory Visit

## 2018-02-09 IMAGING — MR MR ABDOMEN WO/W CM
10 of 18 series · 22 of 48 positions shown · IV contrast (multihance)
Comparison: CTs have 08/03/2016 and back to 07/25/2016.

CLINICAL DATA: Left-sided abdominal pain. CT demonstrating an
indeterminate left renal lesion.

EXAM:
MRI ABDOMEN WITHOUT AND WITH CONTRAST
TECHNIQUE: Multiplanar multisequence MR imaging of the abdomen was performed
both before and after the administration of intravenous contrast.
CONTRAST:  18mL MULTIHANCE GADOBENATE DIMEGLUMINE 529 MG/ML IV SOLN

[Series 4: T2 fat-sat · axial · 5.0mm · 0.78mm/px · z∈[+89,+294]mm · 2 of 42 slices shown]
[im 1/42]
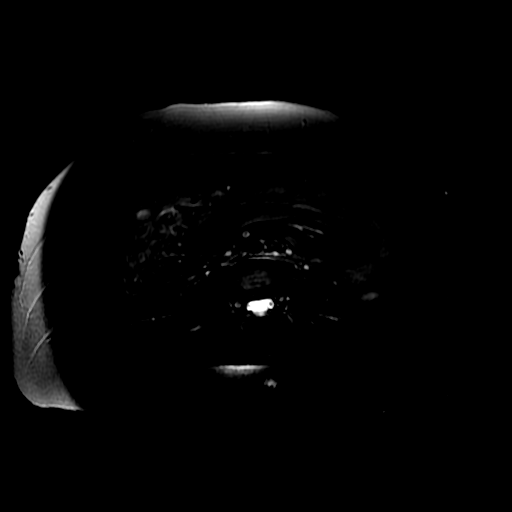
[im 42/42]
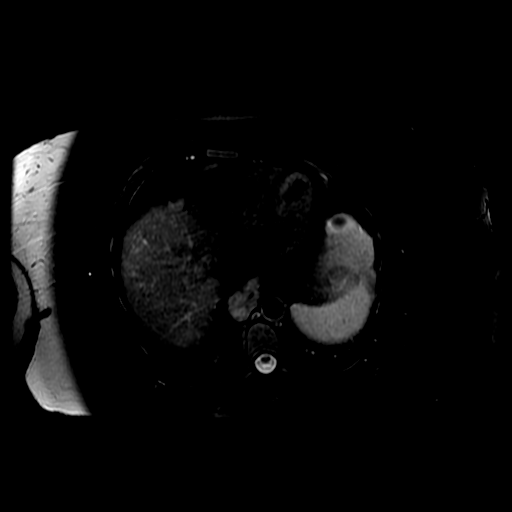

[Series 5: DWI b500 · axial · 5.0mm · 1.56mm/px · z∈[+89,+294]mm · 4 of 84 slices shown]
[im 1/84]
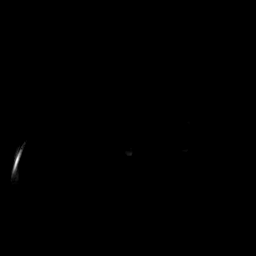
[im 28/84]
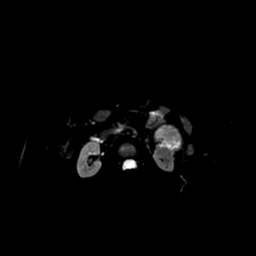
[im 56/84]
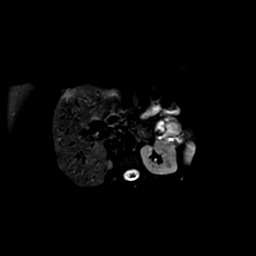
[im 84/84]
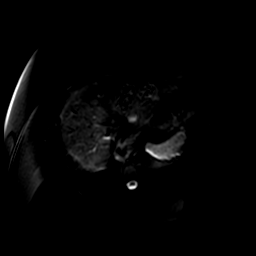

[Series 9: ax dualecho · axial · 5.0mm · 0.78mm/px · z∈[+96,+311]mm · 4 of 88 slices shown]
[im 1/88]
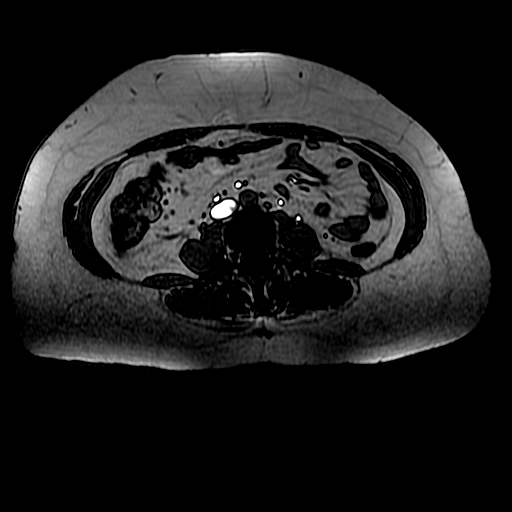
[im 30/88]
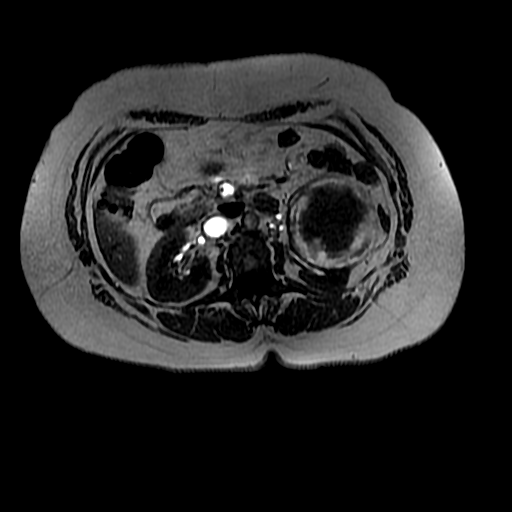
[im 59/88]
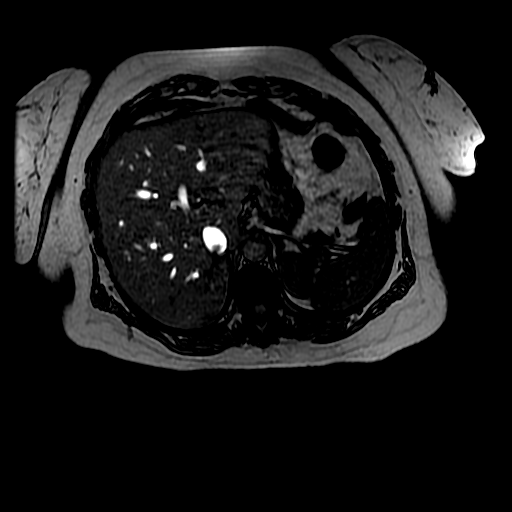
[im 88/88]
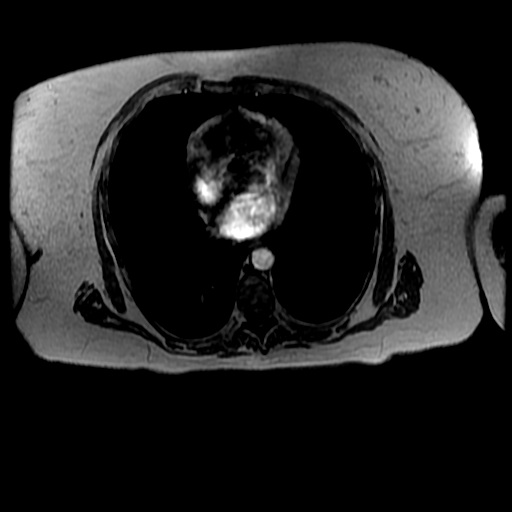

[Series 10: T2 · axial · 5.0mm · 0.78mm/px · z∈[+96,+311]mm · 2 of 44 slices shown (1 of 2)]
[im 1/44]
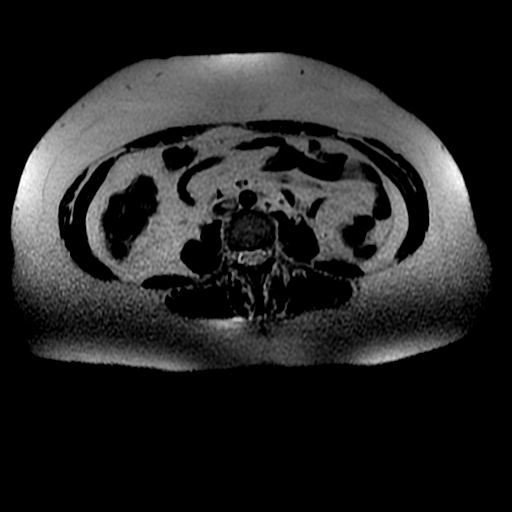
[im 44/44]
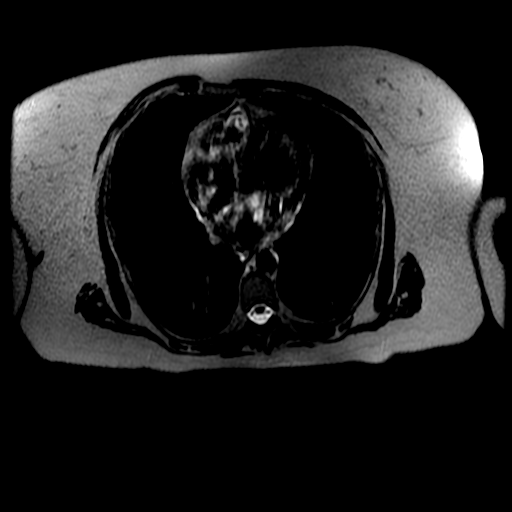

[Series 11: T2 · coronal · 5.0mm · 0.78mm/px · 1 of 46 slices shown (2 of 2)]
[im 1/46]
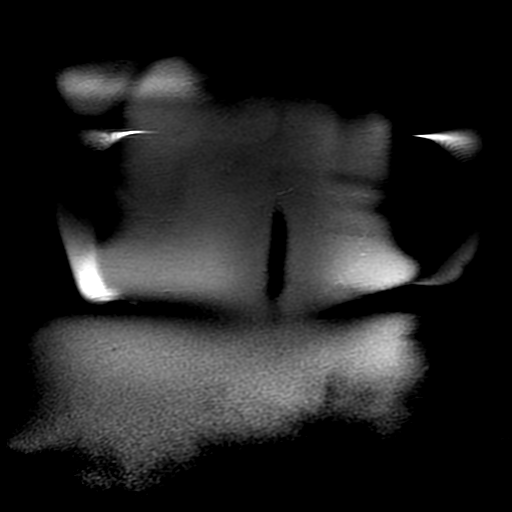

[Series 12: bSSFP · axial · 5.0mm · 0.78mm/px · 1 of 44 slices shown]
[im 1/44]
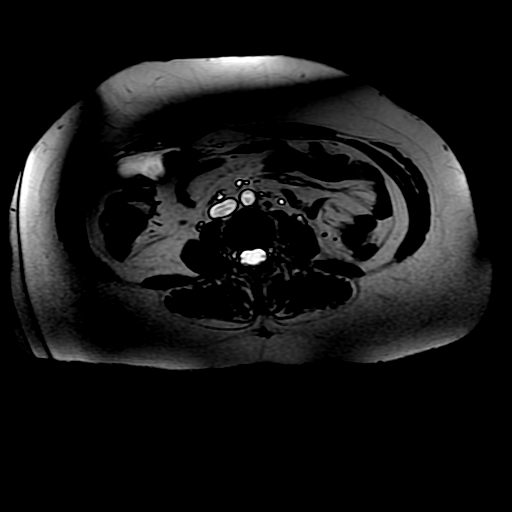

[Series 500: DWI · axial · 5.0mm · 1.56mm/px · 1 of 42 slices shown]
[im 1/42]
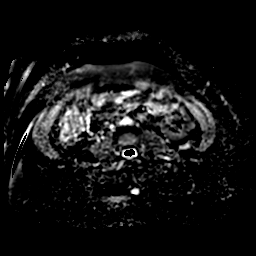

[Series 1300: T1 dynamic · axial · 5.0mm · 0.78mm/px · z∈[+88,+345]mm · 3 of 104 slices shown (1 of 3)]
[im 1/104]
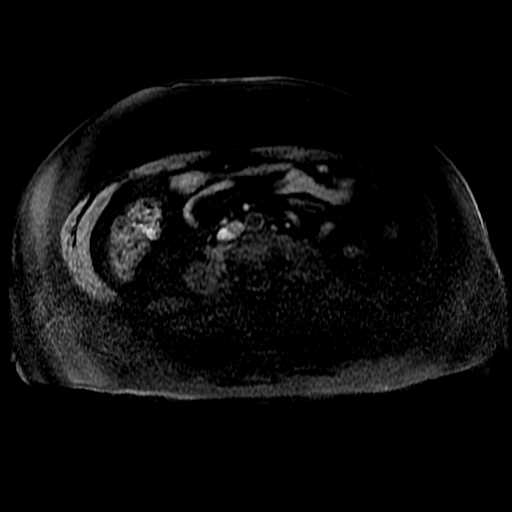
[im 52/104]
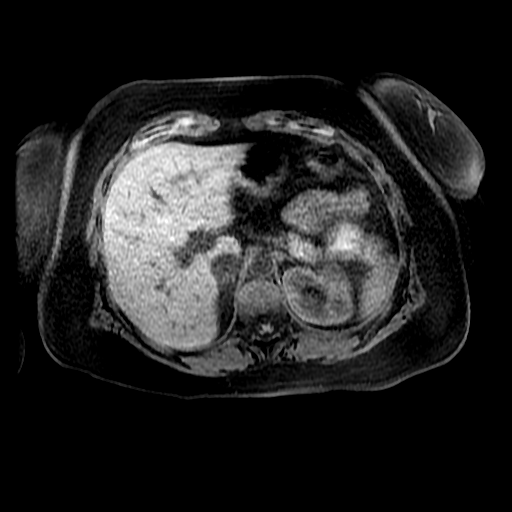
[im 104/104]
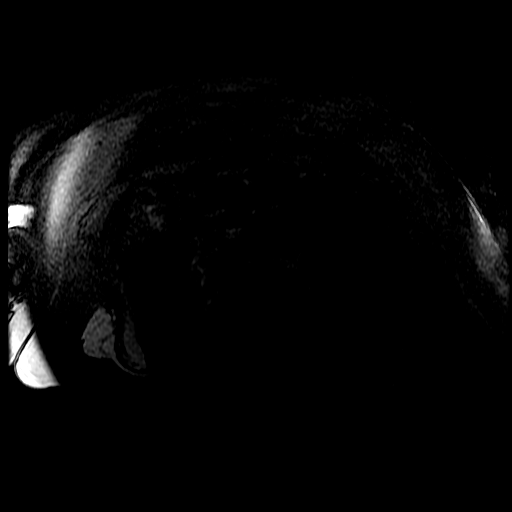

[Series 1301: T1 dynamic · axial · 5.0mm · 0.78mm/px · z∈[+88,+345]mm · 3 of 104 slices shown (2 of 3)]
[im 1/104]
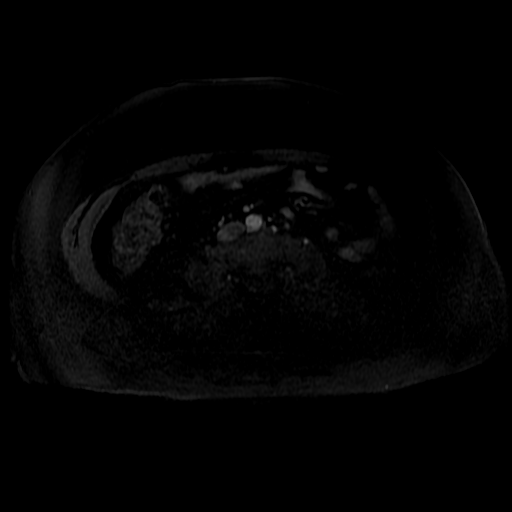
[im 52/104]
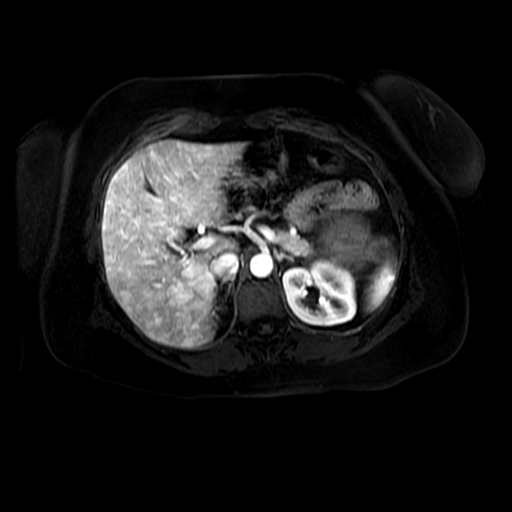
[im 104/104]
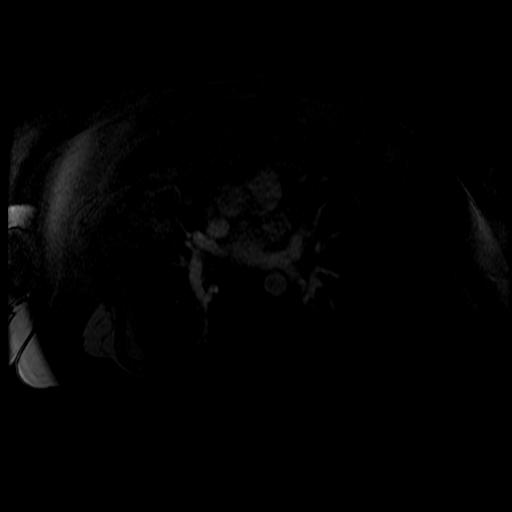

[Series 1302: T1 dynamic · axial · 5.0mm · 0.78mm/px · 1 of 104 slices shown (3 of 3)]
[im 1/104]
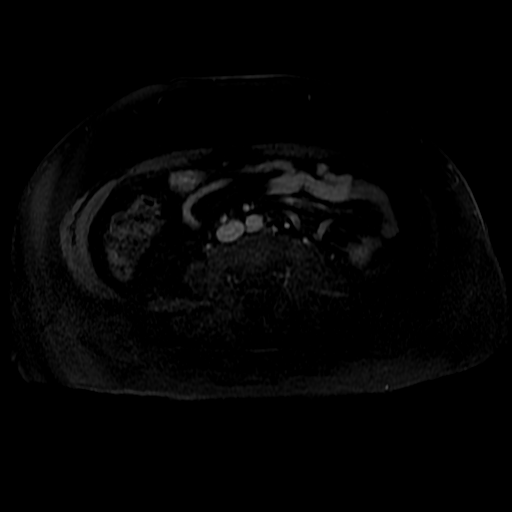

[22 of 48 positions shown; findings below may reference images not displayed]

FINDINGS: Lower chest: Small hiatal hernia. Normal heart size without
pericardial or pleural effusion.

Hepatobiliary: No evidence of cirrhosis. On arterial phase imaging,
there are innumerable areas of hyper enhancement throughout the
liver. These primarily measure on the order of 1.7 cm or less
(example image 51/series 68006). These areas are isointense to the
remainder the liver on precontrast imaging and enhance similar to
the rest of the liver on more delayed post-contrast imaging. No
restricted diffusion.

Normal gallbladder, without biliary ductal dilatation.

Pancreas: Normal, without mass or ductal dilatation.

Spleen: Normal in size, without focal abnormality.

Adrenals/Urinary Tract: Normal adrenal glands.  Normal right kidney.

Corresponding to the CT abnormality, within the interpolar left
kidney anteriorly, is a 7.2 x 7.5 x 8.3 cm lesion. This demonstrates
heterogeneous T2 hyperintensity within. Precontrast heterogeneous T1
hyperintensity, especially superiorly. Areas of relative T1
hypointensity inferiorly and centrally. (Series 3588). After
contrast, there is no convincing evidence of enhancement. Low-level
signal identified on subtracted images is felt to be artifactual,
secondary to minimal motion between pre and postcontrast images.

No hydronephrosis.

Stomach/Bowel: Normal remainder of the stomach. Normal abdominal
bowel loops.

Vascular/Lymphatic: Normal caliber of the aorta and branch vessels.
No retroperitoneal or retrocrural adenopathy.

Other: No ascites.

Musculoskeletal: Probable hemangioma within the T10 vertebral body.
IMPRESSION: 1. Left renal mass is favored to represent a cyst, complicated by 1
or more episodes of hemorrhage. No convincing evidence of underlying
enhancing solid components to suggest renal cell carcinoma.
2. Multiple foci of arterial hyper enhancement throughout the liver.
Favored to represent innumerable areas of focal nodular hyperplasia.
Consider follow-up with pre and post contrast abdominal MRI at 3
months, using Eovist.
3. Hiatal hernia.

## 2018-04-23 DIAGNOSIS — M79671 Pain in right foot: Secondary | ICD-10-CM | POA: Insufficient documentation

## 2018-06-04 DIAGNOSIS — M1711 Unilateral primary osteoarthritis, right knee: Secondary | ICD-10-CM | POA: Insufficient documentation

## 2019-02-09 DIAGNOSIS — M722 Plantar fascial fibromatosis: Secondary | ICD-10-CM | POA: Insufficient documentation

## 2019-11-25 ENCOUNTER — Other Ambulatory Visit: Payer: Self-pay

## 2019-11-25 DIAGNOSIS — Z20822 Contact with and (suspected) exposure to covid-19: Secondary | ICD-10-CM

## 2019-11-27 LAB — NOVEL CORONAVIRUS, NAA: SARS-CoV-2, NAA: NOT DETECTED

## 2020-01-04 ENCOUNTER — Other Ambulatory Visit: Payer: Self-pay

## 2020-01-17 ENCOUNTER — Telehealth: Payer: Self-pay

## 2020-02-24 ENCOUNTER — Ambulatory Visit: Payer: Self-pay | Attending: Internal Medicine

## 2020-02-24 DIAGNOSIS — Z23 Encounter for immunization: Secondary | ICD-10-CM

## 2020-02-24 NOTE — Progress Notes (Signed)
° °  Covid-19 Vaccination Clinic  Name:  Nichole Figueroa    MRN: [id number] DOB: Nov 01, 1971  02/24/2020  [name] was observed post Covid-19 immunization for 15 minutes without incident. She was provided with Vaccine Information Sheet and instruction to access the V-Safe system.   [name] was instructed to call [phone] with any severe reactions post vaccine:  Difficulty breathing   Swelling of face and throat   A fast heartbeat   A bad rash all over body   Dizziness and weakness

## 2020-03-27 ENCOUNTER — Ambulatory Visit: Payer: Self-pay

## 2020-04-04 ENCOUNTER — Telehealth: Payer: Self-pay | Admitting: Internal Medicine

## 2020-04-04 NOTE — Telephone Encounter (Signed)
error 

## 2020-04-05 ENCOUNTER — Institutional Professional Consult (permissible substitution): Payer: Self-pay | Admitting: Internal Medicine

## 2020-04-30 ENCOUNTER — Institutional Professional Consult (permissible substitution): Payer: Self-pay | Admitting: Internal Medicine

## 2020-06-19 ENCOUNTER — Other Ambulatory Visit (HOSPITAL_COMMUNITY)
Admission: RE | Admit: 2020-06-19 | Discharge: 2020-06-19 | Disposition: A | Discharge status: Home / Self Care | Payer: No Typology Code available for payment source | Source: Ambulatory Visit | Attending: Family Medicine | Admitting: Family Medicine

## 2020-06-19 ENCOUNTER — Other Ambulatory Visit: Payer: Self-pay | Admitting: Family Medicine

## 2020-06-19 DIAGNOSIS — Z0000 Encounter for general adult medical examination without abnormal findings: Secondary | ICD-10-CM | POA: Diagnosis present

## 2020-06-21 LAB — CYTOLOGY - PAP
Comment: NEGATIVE
Diagnosis: NEGATIVE
High risk HPV: NEGATIVE

## 2020-11-18 ENCOUNTER — Encounter (HOSPITAL_COMMUNITY): Payer: Self-pay | Admitting: *Deleted

## 2020-11-18 ENCOUNTER — Emergency Department (HOSPITAL_COMMUNITY): Payer: No Typology Code available for payment source

## 2020-11-18 ENCOUNTER — Other Ambulatory Visit: Payer: Self-pay

## 2020-11-18 ENCOUNTER — Emergency Department (HOSPITAL_COMMUNITY)
Admission: EM | Admit: 2020-11-18 | Discharge: 2020-11-18 | Disposition: A | Discharge status: Home / Self Care | Payer: No Typology Code available for payment source | Attending: Emergency Medicine | Admitting: Emergency Medicine

## 2020-11-18 DIAGNOSIS — Z87442 Personal history of urinary calculi: Secondary | ICD-10-CM | POA: Diagnosis not present

## 2020-11-18 DIAGNOSIS — N23 Unspecified renal colic: Secondary | ICD-10-CM

## 2020-11-18 DIAGNOSIS — R109 Unspecified abdominal pain: Secondary | ICD-10-CM | POA: Diagnosis present

## 2020-11-18 DIAGNOSIS — N132 Hydronephrosis with renal and ureteral calculous obstruction: Secondary | ICD-10-CM | POA: Insufficient documentation

## 2020-11-18 LAB — COMPREHENSIVE METABOLIC PANEL
ALT: 12 U/L (ref 0–44)
AST: 17 U/L (ref 15–41)
Albumin: 3.4 g/dL — ABNORMAL LOW (ref 3.5–5.0)
Alkaline Phosphatase: 85 U/L (ref 38–126)
Anion gap: 10 (ref 5–15)
BUN: 19 mg/dL (ref 6–20)
CO2: 23 mmol/L (ref 22–32)
Calcium: 9.2 mg/dL (ref 8.9–10.3)
Chloride: 106 mmol/L (ref 98–111)
Creatinine, Ser: 1.12 mg/dL — ABNORMAL HIGH (ref 0.44–1.00)
GFR, Estimated: 60 mL/min — ABNORMAL LOW (ref >60)
Glucose, Bld: 141 mg/dL — ABNORMAL HIGH (ref 70–99)
Potassium: 3.9 mmol/L (ref 3.5–5.1)
Sodium: 139 mmol/L (ref 135–145)
Total Bilirubin: 0.4 mg/dL (ref 0.3–1.2)
Total Protein: 7.6 g/dL (ref 6.5–8.1)

## 2020-11-18 LAB — URINALYSIS, ROUTINE W REFLEX MICROSCOPIC
Bilirubin Urine: NEGATIVE
Glucose, UA: NEGATIVE mg/dL
Hgb urine dipstick: NEGATIVE
Ketones, ur: NEGATIVE mg/dL
Nitrite: NEGATIVE
Protein, ur: NEGATIVE mg/dL
Specific Gravity, Urine: 1.025 (ref 1.005–1.030)
pH: 5 (ref 5.0–8.0)

## 2020-11-18 LAB — CBC
HCT: 27.8 % — ABNORMAL LOW (ref 36.0–46.0)
Hemoglobin: 7.6 g/dL — ABNORMAL LOW (ref 12.0–15.0)
MCH: 18.6 pg — ABNORMAL LOW (ref 26.0–34.0)
MCHC: 27.3 g/dL — ABNORMAL LOW (ref 30.0–36.0)
MCV: 68.1 fL — ABNORMAL LOW (ref 80.0–100.0)
Platelets: 338 10*3/uL (ref 150–400)
RBC: 4.08 MIL/uL (ref 3.87–5.11)
RDW: 21.9 % — ABNORMAL HIGH (ref 11.5–15.5)
WBC: 8 10*3/uL (ref 4.0–10.5)
nRBC: 0 % (ref 0.0–0.2)

## 2020-11-18 LAB — POC OCCULT BLOOD, ED: Fecal Occult Bld: NEGATIVE

## 2020-11-18 LAB — I-STAT BETA HCG BLOOD, ED (MC, WL, AP ONLY): I-stat hCG, quantitative: 6.9 m[IU]/mL — ABNORMAL HIGH (ref <5)

## 2020-11-18 LAB — LIPASE, BLOOD: Lipase: 32 U/L (ref 11–51)

## 2020-11-18 MED ORDER — ONDANSETRON HCL 4 MG/2ML IJ SOLN
4.0000 mg | Freq: Once | INTRAMUSCULAR | Status: AC
  Administered 2020-11-18: 4 mg via INTRAVENOUS
  Filled 2020-11-18: qty 2

## 2020-11-18 MED ORDER — KETOROLAC TROMETHAMINE 60 MG/2ML IM SOLN
30.0000 mg | Freq: Once | INTRAMUSCULAR | Status: DC
  Filled 2020-11-18: qty 2

## 2020-11-18 MED ORDER — IOHEXOL 350 MG/ML SOLN
80.0000 mL | Freq: Once | INTRAVENOUS | Status: AC | PRN
  Administered 2020-11-18: 80 mL via INTRAVENOUS

## 2020-11-18 MED ORDER — FENTANYL CITRATE (PF) 100 MCG/2ML IJ SOLN
50.0000 ug | Freq: Once | INTRAMUSCULAR | Status: AC
  Administered 2020-11-18: 50 ug via INTRAVENOUS
  Filled 2020-11-18: qty 2

## 2020-11-18 MED ORDER — HYDROMORPHONE HCL 1 MG/ML IJ SOLN
1.0000 mg | Freq: Once | INTRAMUSCULAR | Status: AC
  Administered 2020-11-18: 1 mg via INTRAVENOUS
  Filled 2020-11-18: qty 1

## 2020-11-18 MED ORDER — TAMSULOSIN HCL 0.4 MG PO CAPS: 0.4000 mg | ORAL_CAPSULE | Freq: Every day | ORAL | 0 refills | Status: DC

## 2020-11-18 MED ORDER — ONDANSETRON 8 MG PO TBDP: 8.0000 mg | ORAL_TABLET | Freq: Three times a day (TID) | ORAL | 0 refills | Status: DC | PRN

## 2020-11-18 MED ORDER — HYDROMORPHONE HCL 4 MG PO TABS: 4.0000 mg | ORAL_TABLET | ORAL | 0 refills | Status: DC | PRN

## 2020-11-18 MED ORDER — KETOROLAC TROMETHAMINE 30 MG/ML IJ SOLN
30.0000 mg | Freq: Once | INTRAMUSCULAR | Status: AC
  Administered 2020-11-18: 30 mg via INTRAVENOUS

## 2020-11-18 NOTE — Discharge Instructions (Addendum)
Please begin over-the-counter iron. You have a kidney stone on the right.  It should pass on its own.  You are given some pain medicine, nausea medicine, and medicine to help it pass. Please call Dr. Alyson Ingles tomorrow and arrange follow-up this week for abnormality seen on your CT in the left kidney and the kidney stone on the right. Please call your doctor for follow-up as your blood count is low.  You do not appear to have symptoms from that but this should be closely followed. Return to the emergency department if you have worsening pain, unable to tolerate liquids, or have new symptoms.

## 2020-11-18 NOTE — ED Provider Notes (Signed)
[facility] EMERGENCY DEPARTMENT Provider Note   CSN: [id number] Arrival date & time: 11/18/20  0540     History Chief Complaint  Patient presents with  . Flank Pain    Nichole Figueroa is a 49 y.o. female.  HPI    49 year old female history of left renal cyst, hemorrhage of left kidney, status post hysterectomy presents today complaining of right flank pain that began at 3 AM.  She states she was watching a movie with her husband when she noted the pain beginning.  It became severe.  She had associated nausea and vomiting multiple times without hematemesis, coffee-ground emesis, or bright red blood noted.  She has continued pain.  Does not take any intervention. Past Medical History:  Diagnosis Date  . Kidney stone     Patient Active Problem List   Diagnosis Date Noted  . Hemorrhage of left kidney 07/25/2016  . Renal hemorrhage, left 07/25/2016    Past Surgical History:  Procedure Laterality Date  . ABDOMINAL HYSTERECTOMY N/A 2003     OB History   No obstetric history on file.     No family history on file.  Social History   Tobacco Use  . Smoking status: Never Smoker  . Smokeless tobacco: Never Used  Substance Use Topics  . Alcohol use: No  . Drug use: Not on file    Home Medications Prior to Admission medications   Medication Sig Start Date End Date Taking? Authorizing Provider  HYDROmorphone (DILAUDID) 4 MG tablet Take 1 tablet (4 mg total) by mouth every 4 (four) hours as needed for moderate pain or severe pain. 07/29/16   McKenzie, Candee Furbish, MD  HYDROmorphone (DILAUDID) 4 MG tablet Take 1 tablet (4 mg total) by mouth every 6 (six) hours as needed for severe pain. 08/04/16   Duffy Bruce, MD  omeprazole (PRILOSEC) 20 MG capsule Take 20-40 mg by mouth at bedtime as needed (heartburn).     [provider]  ondansetron (ZOFRAN ODT) 4 MG disintegrating tablet Take 1 tablet (4 mg total) by mouth every 8 (eight) hours as needed for  nausea or vomiting. 07/29/16   McKenzie, Candee Furbish, MD    Allergies    Codeine, Hydrocodone, Percocet [oxycodone-acetaminophen], and Tramadol  Review of Systems   Review of Systems  All other systems reviewed and are negative.   Physical Exam Updated Vital Signs BP (!) 150/98   Pulse 85   Temp 98.2 F (36.8 C)   Resp 16   Ht 1.626 m (5\' 4" )   Wt 92.1 kg   LMP  (LMP Unknown) Comment: HCG neg  SpO2 98%   BMI 34.84 kg/m   Physical Exam Vitals and nursing note reviewed.  Constitutional:      Appearance: Normal appearance.  HENT:     Head: Normocephalic.     Right Ear: External ear normal.     Left Ear: External ear normal.     Nose: Nose normal.     Mouth/Throat:     Mouth: Mucous membranes are moist.  Eyes:     Extraocular Movements: Extraocular movements intact.     Pupils: Pupils are equal, round, and reactive to light.  Cardiovascular:     Rate and Rhythm: Normal rate and regular rhythm.     Pulses: Normal pulses.  Pulmonary:     Effort: Pulmonary effort is normal.  Abdominal:     General: Abdomen is flat.     Palpations: Abdomen is soft.  Musculoskeletal:  General: Normal range of motion.     Cervical back: Normal range of motion.  Skin:    Capillary Refill: Capillary refill takes less than 2 seconds.     Coloration: Skin is jaundiced.  Neurological:     General: No focal deficit present.     Mental Status: She is alert.  Psychiatric:        Mood and Affect: Mood normal.     ED Results / Procedures / Treatments   Labs (all labs ordered are listed, but only abnormal results are displayed) Labs Reviewed  COMPREHENSIVE METABOLIC PANEL - Abnormal; Notable for the following components:      Result Value   Glucose, Bld 141 (*)    Creatinine, Ser 1.12 (*)    Albumin 3.4 (*)    GFR, Estimated 60 (*)    All other components within normal limits  CBC - Abnormal; Notable for the following components:   Hemoglobin 7.6 (*)    HCT 27.8 (*)    MCV 68.1  (*)    MCH 18.6 (*)    MCHC 27.3 (*)    RDW 21.9 (*)    All other components within normal limits  URINALYSIS, ROUTINE W REFLEX MICROSCOPIC - Abnormal; Notable for the following components:   APPearance HAZY (*)    Leukocytes,Ua SMALL (*)    Bacteria, UA RARE (*)    All other components within normal limits  I-STAT BETA HCG BLOOD, ED ([facility], [facility], [facility] ONLY) - Abnormal; Notable for the following components:   I-stat hCG, quantitative 6.9 (*)    All other components within normal limits  LIPASE, BLOOD    EKG None  Radiology CT Angio Abd/Pel W and/or Wo Contrast  Result Date: 11/18/2020 CLINICAL DATA:  Right flank pain EXAM: CTA ABDOMEN AND PELVIS WITHOUT AND WITH CONTRAST TECHNIQUE: Multidetector CT imaging of the abdomen and pelvis was performed using the standard protocol during bolus administration of intravenous contrast. Multiplanar reconstructed images and MIPs were obtained and reviewed to evaluate the vascular anatomy. CONTRAST:  76mL OMNIPAQUE IOHEXOL 350 MG/ML SOLN COMPARISON:  08/03/2016 FINDINGS: VASCULAR Aorta: Normal caliber aorta without aneurysm, dissection, vasculitis or significant stenosis. Celiac: Patent without evidence of aneurysm, dissection, vasculitis or significant stenosis. SMA: Patent without evidence of aneurysm, dissection, vasculitis or significant stenosis. Renals: Both renal arteries are patent without evidence of aneurysm, dissection, vasculitis, fibromuscular dysplasia or significant stenosis. IMA: Patent without evidence of aneurysm, dissection, vasculitis or significant stenosis. Inflow: Patent without evidence of aneurysm, dissection, vasculitis or significant stenosis. Proximal Outflow: Bilateral common femoral and visualized portions of the superficial and profunda femoral arteries are patent without evidence of aneurysm, dissection, vasculitis or significant stenosis. Veins: No obvious venous abnormality within the limitations of this arterial phase study.  Review of the MIP images confirms the above findings. NON-VASCULAR Lower chest: Patchy areas of ground-glass attenuation within the included lung bases. Hepatobiliary: Heterogeneous enhancement pattern of the liver, which is similar in appearance to the prior studies and may represent multifocal sites of focal nodular hyperplasia. Unremarkable appearance of the gallbladder. No hyperdense gallstone. No biliary dilatation. Pancreas: Unremarkable. No pancreatic ductal dilatation or surrounding inflammatory changes. Spleen: Arterial enhancement pattern of the spleen without evidence of focal lesion. Adrenals/Urinary Tract: Unremarkable adrenal glands. Lobulated complex cystic lesion within the left kidney is again seen and measures approximately 5.1 x 3.8 x 4.9 cm (previously measured 5.4 x 5.0 x 5.4 cm on MRI 10/24/2016). There is an area of increased density along the anterolateral wall  of this cystic lesion (series 5, image 80) which could reflect an area of enhancement, although no precontrast images were obtained. There is a 2 mm stone at the right ureterovesical junction resulting in mild right hydroureteronephrosis. There is mild right perinephric stranding. No left-sided stone or hydronephrosis. Urinary bladder is incompletely distended, limiting its evaluation. Stomach/Bowel: Moderate-sized hiatal hernia. Stomach is otherwise within normal limits. 12 mm lipoma in the region of the duodenal bulb, unchanged. No dilated loops of small bowel. Normal appendix is present in the right lower quadrant (series 5, image 161). Colon is redundant. No focal bowel wall thickening or inflammatory changes. Lymphatic: No abdominopelvic lymphadenopathy. Reproductive: Status post hysterectomy. No adnexal masses. Other: No free fluid. No abdominopelvic fluid collection. No pneumoperitoneum. No abdominal wall hernia. Musculoskeletal: No acute or significant osseous findings. IMPRESSION: VASCULAR 1. No acute vascular findings.  Specifically, examination is negative for abdominal aortic aneurysm or dissection. NON-VASCULAR 1. Obstructing 2 mm stone at the right ureterovesical junction resulting in mild right hydroureteronephrosis. 2. Slight interval decrease in size of the complex left renal cystic lesion compared to the prior study. There is an area of increased density along the anterolateral wall of this cystic lesion which could reflect an area of enhancement, although no pre-contrast images were obtained. Recommend follow-up with nonemergent contrast enhanced MRI of the abdomen to exclude a solid enhancing component. 3. Heterogeneous enhancement pattern of the liver, which is similar in appearance to the prior studies and may represent multifocal sites of focal nodular hyperplasia. This can also be further evaluated on the previously recommended MRI. 4. Patchy areas of ground-glass attenuation within the included lung bases, which may represent atelectasis, air trapping, versus an infectious or inflammatory process. 5. Moderate-sized hiatal hernia. Electronically Signed   By: Davina Poke D.O.   On: 11/18/2020 10:17    Procedures Procedures (including critical care time)  Medications Ordered in ED Medications - No data to display  ED Course  I have reviewed the triage vital signs and the nursing notes.  Pertinent labs & imaging results that were available during my care of the patient were reviewed by me and considered in my medical decision making (see chart for details).  Clinical Course as of Nov 19 1055  Sun Nov 18, 2020  0855 Labs reviewed Hcg 6- patient s/p hysterectomy Awaiting cta   [DR]    Clinical Course User Index [DR] Pattricia Boss, MD   MDM Rules/Calculators/A&P                          Patient is presents today with right flank pain which appears to be secondary to right 2 mm UVJ stone with ureteral colic.  Patient is also noted to be significantly anemic but does not have any obvious source  of bleeding today.  She has had a history of anemia in the past.  She does not appear to be symptomatic with normal heart rate and blood pressure.  We discussed beginning iron.  She will need prompt follow-up with her primary care regarding her anemia.  She has been followed by Dr. Alyson Ingles in the past for left sided kidney issues.  She is referred back to him regarding the stone in follow-up from some abnormality noted in the left kidney today.  Above was discussed with patient and she voices understanding. Final Clinical Impression(s) / ED Diagnoses Final diagnoses:  Right flank pain  Ureteral colic    Rx / DC Orders ED  Discharge Orders    None       Pattricia Boss, MD 11/18/20 1118

## 2020-11-18 NOTE — ED Notes (Signed)
Oxygen turned off at this time to see if pt maintains SPO2

## 2020-11-18 NOTE — ED Triage Notes (Signed)
The pt is c/o rt  Flank pain since 0300am  She is c/o not being able to void for one hour

## 2021-11-11 DIAGNOSIS — M1711 Unilateral primary osteoarthritis, right knee: Secondary | ICD-10-CM | POA: Diagnosis not present

## 2021-11-11 DIAGNOSIS — M25561 Pain in right knee: Secondary | ICD-10-CM | POA: Insufficient documentation

## 2021-11-21 DIAGNOSIS — B349 Viral infection, unspecified: Secondary | ICD-10-CM | POA: Diagnosis not present

## 2021-11-21 DIAGNOSIS — J029 Acute pharyngitis, unspecified: Secondary | ICD-10-CM | POA: Diagnosis not present

## 2021-11-21 DIAGNOSIS — R051 Acute cough: Secondary | ICD-10-CM | POA: Diagnosis not present

## 2021-11-21 DIAGNOSIS — R509 Fever, unspecified: Secondary | ICD-10-CM | POA: Diagnosis not present

## 2021-11-29 DIAGNOSIS — M542 Cervicalgia: Secondary | ICD-10-CM | POA: Diagnosis not present

## 2021-12-02 DIAGNOSIS — M25561 Pain in right knee: Secondary | ICD-10-CM | POA: Diagnosis not present

## 2021-12-10 ENCOUNTER — Other Ambulatory Visit: Payer: Self-pay

## 2021-12-10 DIAGNOSIS — I872 Venous insufficiency (chronic) (peripheral): Secondary | ICD-10-CM

## 2021-12-11 DIAGNOSIS — M5451 Vertebrogenic low back pain: Secondary | ICD-10-CM | POA: Diagnosis not present

## 2021-12-11 DIAGNOSIS — M5136 Other intervertebral disc degeneration, lumbar region: Secondary | ICD-10-CM | POA: Diagnosis not present

## 2021-12-11 DIAGNOSIS — M5459 Other low back pain: Secondary | ICD-10-CM | POA: Diagnosis not present

## 2021-12-13 DIAGNOSIS — M545 Low back pain, unspecified: Secondary | ICD-10-CM | POA: Insufficient documentation

## 2021-12-18 DIAGNOSIS — M25561 Pain in right knee: Secondary | ICD-10-CM | POA: Diagnosis not present

## 2021-12-18 DIAGNOSIS — M5416 Radiculopathy, lumbar region: Secondary | ICD-10-CM | POA: Diagnosis not present

## 2021-12-24 DIAGNOSIS — M5451 Vertebrogenic low back pain: Secondary | ICD-10-CM | POA: Diagnosis not present

## 2021-12-25 ENCOUNTER — Ambulatory Visit (HOSPITAL_COMMUNITY): Payer: BC Managed Care – PPO

## 2021-12-25 DIAGNOSIS — Z1231 Encounter for screening mammogram for malignant neoplasm of breast: Secondary | ICD-10-CM | POA: Diagnosis not present

## 2021-12-26 ENCOUNTER — Ambulatory Visit (HOSPITAL_COMMUNITY)
Admission: RE | Admit: 2021-12-26 | Discharge: 2021-12-26 | Disposition: A | Discharge status: Home / Self Care | Payer: BC Managed Care – PPO | Source: Ambulatory Visit | Attending: Vascular Surgery | Admitting: Vascular Surgery

## 2021-12-26 ENCOUNTER — Other Ambulatory Visit: Payer: Self-pay

## 2021-12-26 ENCOUNTER — Ambulatory Visit: Payer: BC Managed Care – PPO | Admitting: Physician Assistant

## 2021-12-26 VITALS — BP 111/76 | HR 66 | Temp 97.8°F | Resp 20 | Ht 64.0 in | Wt 199.7 lb

## 2021-12-26 DIAGNOSIS — I82441 Acute embolism and thrombosis of right tibial vein: Secondary | ICD-10-CM | POA: Diagnosis not present

## 2021-12-26 DIAGNOSIS — I872 Venous insufficiency (chronic) (peripheral): Secondary | ICD-10-CM

## 2021-12-26 DIAGNOSIS — F419 Anxiety disorder, unspecified: Secondary | ICD-10-CM | POA: Insufficient documentation

## 2021-12-26 DIAGNOSIS — D509 Iron deficiency anemia, unspecified: Secondary | ICD-10-CM | POA: Insufficient documentation

## 2021-12-26 DIAGNOSIS — K219 Gastro-esophageal reflux disease without esophagitis: Secondary | ICD-10-CM | POA: Insufficient documentation

## 2021-12-26 DIAGNOSIS — F322 Major depressive disorder, single episode, severe without psychotic features: Secondary | ICD-10-CM | POA: Insufficient documentation

## 2021-12-26 MED ORDER — RIVAROXABAN 20 MG PO TABS: 20.0000 mg | ORAL_TABLET | Freq: Every day | ORAL | 1 refills | Status: DC

## 2021-12-26 MED ORDER — RIVAROXABAN (XARELTO) VTE STARTER PACK (15 & 20 MG): ORAL_TABLET | ORAL | 0 refills | Status: DC

## 2021-12-26 NOTE — Progress Notes (Signed)
Office Note     CC:  follow up Requesting Provider:  Donald Prose, MD  HPI: Nichole Figueroa is a 51 y.o. ([birth year]) female who presents with bilateral lower extremity edema and discomfort right more than left leg.  She states she has had edema for several months however has noticed this worsening over the past few weeks.  She was also diagnosed with a right meniscus tear recently which is impacted her mobility.  She works as a Engineer, manufacturing.  She denies any history of DVT, venous ulcerations, trauma, or prior vascular intervention.  She does not wear compression or elevate her legs during the day.  She denies tobacco use.   Past Medical History:  Diagnosis Date   Kidney stone     Past Surgical History:  Procedure Laterality Date   ABDOMINAL HYSTERECTOMY N/A 2003    Social History   Socioeconomic History   Marital status: Married    Spouse name: Not on file   Number of children: Not on file   Years of education: Not on file   Highest education level: Not on file  Occupational History   Not on file  Tobacco Use   Smoking status: Never   Smokeless tobacco: Never  Substance and Sexual Activity   Alcohol use: No   Drug use: Not on file   Sexual activity: Not on file  Other Topics Concern   Not on file  Social History Narrative   Not on file   Social Determinants of Health   Financial Resource Strain: Not on file  Food Insecurity: Not on file  Transportation Needs: Not on file  Physical Activity: Not on file  Stress: Not on file  Social Connections: Not on file  Intimate Partner Violence: Not on file   History reviewed. No pertinent family history.  Current Outpatient Medications  Medication Sig Dispense Refill   meloxicam (MOBIC) 7.5 MG tablet meloxicam 7.5 mg tablet     omeprazole (PRILOSEC OTC) 20 MG tablet Take by mouth.     [START ON 01/24/2022] rivaroxaban (XARELTO) 20 MG TABS tablet Take 1 tablet (20 mg total) by mouth daily with supper. 30 tablet 1    RIVAROXABAN (XARELTO) VTE STARTER PACK (15 & 20 MG) Follow package directions: Take one 15mg  tablet by mouth twice a day. On day 22, switch to one 20mg  tablet once a day. Take with food. 51 each 0   No current facility-administered medications for this visit.    Allergies  Allergen Reactions   Codeine Nausea And Vomiting   Hydrocodone Nausea And Vomiting   Hydrocodone-Acetaminophen     Other reaction(s): nausea   Percocet [Oxycodone-Acetaminophen] Itching and Nausea And Vomiting   Tramadol Hives     REVIEW OF SYSTEMS:   [X]  denotes positive finding, [ ]  denotes negative finding Cardiac  Comments:  Chest pain or chest pressure:    Shortness of breath upon exertion:    Short of breath when lying flat:    Irregular heart rhythm:        Vascular    Pain in calf, thigh, or hip brought on by ambulation:    Pain in feet at night that wakes you up from your sleep:     Blood clot in your veins:    Leg swelling:         Pulmonary    Oxygen at home:    Productive cough:     Wheezing:         Neurologic  Sudden weakness in arms or legs:     Sudden numbness in arms or legs:     Sudden onset of difficulty speaking or slurred speech:    Temporary loss of vision in one eye:     Problems with dizziness:         Gastrointestinal    Blood in stool:     Vomited blood:         Genitourinary    Burning when urinating:     Blood in urine:        Psychiatric    Major depression:         Hematologic    Bleeding problems:    Problems with blood clotting too easily:        Skin    Rashes or ulcers:        Constitutional    Fever or chills:      PHYSICAL EXAMINATION:  Vitals:   12/26/21 1142  BP: 111/76  Pulse: 66  Resp: 20  Temp: 97.8 F (36.6 C)  TempSrc: Temporal  SpO2: 97%  Weight: 199 lb 11.2 oz (90.6 kg)  Height: 5\' 4"  (1.626 m)    General:  WDWN in NAD; vital signs documented above Gait: Not observed HENT: WNL, normocephalic Pulmonary: normal  non-labored breathing , without Rales, rhonchi,  wheezing Cardiac: regular HR Abdomen: soft, NT, no masses Skin: without rashes Vascular Exam/Pulses:  Right Left  Radial 2+ (normal) 2+ (normal)  DP 2+ (normal) 2+ (normal)   Extremities: No significant pitting edema bilateral lower extremities; varicosities noted medial right lower leg; no significant right calf pain with palpation Musculoskeletal: no muscle wasting or atrophy  Neurologic: A&O X 3;  No focal weakness or paresthesias are detected Psychiatric:  The pt has Normal affect.   Non-Invasive Vascular Imaging:   Right lower extremity venous reflux study shows reflux at the saphenofemoral junction and GSV at the proximal calf.  It also shows reflux in the small saphenous vein in the popliteal fossa.  There was also an incidental finding of age-indeterminate thrombus of the posterior tibial vein of the mid calf    ASSESSMENT/PLAN:: 51 y.o. female with increasing edema and discomfort of bilateral lower extremities; right leg is worse than left  -Right lower extremity venous reflux study demonstrates mild superficial venous insufficiency -There was also an incidental finding of age-indeterminate thrombus of the posterior tibial vein in the mid calf.  Given her history this will be considered symptomatic.  It is unclear if this is a acute or chronic thrombus thus we will treat with anticoagulation for a period of 3 months.  I have prescribed the Xarelto starter pack as well as 2 additional months of refills.  We will rescan the right leg with a venous duplex study in 3 months.  She will call/return office sooner with any questions or concerns.   Dagoberto Ligas, PA-C [facility] [phone]  Clinic MD:   [place] [place]

## 2021-12-27 DIAGNOSIS — M1711 Unilateral primary osteoarthritis, right knee: Secondary | ICD-10-CM | POA: Diagnosis not present

## 2021-12-31 ENCOUNTER — Other Ambulatory Visit: Payer: Self-pay | Admitting: *Deleted

## 2021-12-31 DIAGNOSIS — I82441 Acute embolism and thrombosis of right tibial vein: Secondary | ICD-10-CM

## 2021-12-31 DIAGNOSIS — M25512 Pain in left shoulder: Secondary | ICD-10-CM | POA: Diagnosis not present

## 2021-12-31 DIAGNOSIS — M542 Cervicalgia: Secondary | ICD-10-CM | POA: Diagnosis not present

## 2021-12-31 DIAGNOSIS — M25511 Pain in right shoulder: Secondary | ICD-10-CM | POA: Diagnosis not present

## 2021-12-31 DIAGNOSIS — I872 Venous insufficiency (chronic) (peripheral): Secondary | ICD-10-CM

## 2022-01-01 DIAGNOSIS — M5417 Radiculopathy, lumbosacral region: Secondary | ICD-10-CM | POA: Diagnosis not present

## 2022-01-02 DIAGNOSIS — M25511 Pain in right shoulder: Secondary | ICD-10-CM | POA: Diagnosis not present

## 2022-01-02 DIAGNOSIS — M5417 Radiculopathy, lumbosacral region: Secondary | ICD-10-CM | POA: Diagnosis not present

## 2022-01-02 DIAGNOSIS — M25512 Pain in left shoulder: Secondary | ICD-10-CM | POA: Diagnosis not present

## 2022-01-02 DIAGNOSIS — M542 Cervicalgia: Secondary | ICD-10-CM | POA: Diagnosis not present

## 2022-01-08 DIAGNOSIS — M25511 Pain in right shoulder: Secondary | ICD-10-CM | POA: Diagnosis not present

## 2022-01-08 DIAGNOSIS — M542 Cervicalgia: Secondary | ICD-10-CM | POA: Diagnosis not present

## 2022-01-08 DIAGNOSIS — M25512 Pain in left shoulder: Secondary | ICD-10-CM | POA: Diagnosis not present

## 2022-01-10 DIAGNOSIS — M25511 Pain in right shoulder: Secondary | ICD-10-CM | POA: Diagnosis not present

## 2022-01-10 DIAGNOSIS — M25512 Pain in left shoulder: Secondary | ICD-10-CM | POA: Diagnosis not present

## 2022-01-10 DIAGNOSIS — M542 Cervicalgia: Secondary | ICD-10-CM | POA: Diagnosis not present

## 2022-01-14 DIAGNOSIS — M542 Cervicalgia: Secondary | ICD-10-CM | POA: Diagnosis not present

## 2022-01-14 DIAGNOSIS — M25511 Pain in right shoulder: Secondary | ICD-10-CM | POA: Diagnosis not present

## 2022-01-14 DIAGNOSIS — M25512 Pain in left shoulder: Secondary | ICD-10-CM | POA: Diagnosis not present

## 2022-01-16 ENCOUNTER — Encounter (HOSPITAL_COMMUNITY): Payer: BC Managed Care – PPO

## 2022-01-16 DIAGNOSIS — M5451 Vertebrogenic low back pain: Secondary | ICD-10-CM | POA: Diagnosis not present

## 2022-01-21 DIAGNOSIS — M542 Cervicalgia: Secondary | ICD-10-CM | POA: Diagnosis not present

## 2022-01-21 DIAGNOSIS — R040 Epistaxis: Secondary | ICD-10-CM | POA: Diagnosis not present

## 2022-01-21 DIAGNOSIS — M5451 Vertebrogenic low back pain: Secondary | ICD-10-CM | POA: Diagnosis not present

## 2022-01-21 DIAGNOSIS — I82501 Chronic embolism and thrombosis of unspecified deep veins of right lower extremity: Secondary | ICD-10-CM | POA: Diagnosis not present

## 2022-01-21 DIAGNOSIS — M5136 Other intervertebral disc degeneration, lumbar region: Secondary | ICD-10-CM | POA: Diagnosis not present

## 2022-01-21 DIAGNOSIS — M5417 Radiculopathy, lumbosacral region: Secondary | ICD-10-CM | POA: Diagnosis not present

## 2022-01-22 ENCOUNTER — Other Ambulatory Visit: Payer: Self-pay | Admitting: Family Medicine

## 2022-01-22 DIAGNOSIS — M542 Cervicalgia: Secondary | ICD-10-CM

## 2022-01-23 DIAGNOSIS — M25512 Pain in left shoulder: Secondary | ICD-10-CM | POA: Diagnosis not present

## 2022-01-23 DIAGNOSIS — M25511 Pain in right shoulder: Secondary | ICD-10-CM | POA: Diagnosis not present

## 2022-01-23 DIAGNOSIS — M542 Cervicalgia: Secondary | ICD-10-CM | POA: Diagnosis not present

## 2022-01-28 DIAGNOSIS — N281 Cyst of kidney, acquired: Secondary | ICD-10-CM | POA: Diagnosis not present

## 2022-01-30 DIAGNOSIS — M5451 Vertebrogenic low back pain: Secondary | ICD-10-CM | POA: Diagnosis not present

## 2022-01-30 DIAGNOSIS — M5417 Radiculopathy, lumbosacral region: Secondary | ICD-10-CM | POA: Diagnosis not present

## 2022-01-31 DIAGNOSIS — M25511 Pain in right shoulder: Secondary | ICD-10-CM | POA: Diagnosis not present

## 2022-01-31 DIAGNOSIS — M542 Cervicalgia: Secondary | ICD-10-CM | POA: Diagnosis not present

## 2022-01-31 DIAGNOSIS — M25512 Pain in left shoulder: Secondary | ICD-10-CM | POA: Diagnosis not present

## 2022-02-07 ENCOUNTER — Ambulatory Visit
Admission: RE | Admit: 2022-02-07 | Discharge: 2022-02-07 | Disposition: A | Discharge status: Home / Self Care | Payer: BC Managed Care – PPO | Source: Ambulatory Visit | Attending: Family Medicine | Admitting: Family Medicine

## 2022-02-07 DIAGNOSIS — M2578 Osteophyte, vertebrae: Secondary | ICD-10-CM | POA: Diagnosis not present

## 2022-02-07 DIAGNOSIS — M47812 Spondylosis without myelopathy or radiculopathy, cervical region: Secondary | ICD-10-CM | POA: Diagnosis not present

## 2022-02-07 DIAGNOSIS — H47393 Other disorders of optic disc, bilateral: Secondary | ICD-10-CM | POA: Diagnosis not present

## 2022-02-07 DIAGNOSIS — M542 Cervicalgia: Secondary | ICD-10-CM

## 2022-02-11 DIAGNOSIS — H40003 Preglaucoma, unspecified, bilateral: Secondary | ICD-10-CM | POA: Diagnosis not present

## 2022-02-27 DIAGNOSIS — M5451 Vertebrogenic low back pain: Secondary | ICD-10-CM | POA: Diagnosis not present

## 2022-02-27 DIAGNOSIS — M5417 Radiculopathy, lumbosacral region: Secondary | ICD-10-CM | POA: Diagnosis not present

## 2022-02-27 DIAGNOSIS — M5416 Radiculopathy, lumbar region: Secondary | ICD-10-CM | POA: Diagnosis not present

## 2022-03-03 DIAGNOSIS — U071 COVID-19: Secondary | ICD-10-CM | POA: Diagnosis not present

## 2022-03-09 DIAGNOSIS — N281 Cyst of kidney, acquired: Secondary | ICD-10-CM | POA: Diagnosis not present

## 2022-03-24 NOTE — Progress Notes (Signed)
?Office Note  ? ? ? ?CC:  follow up ?Requesting Provider:  Donald Prose, MD ? ?HPI: Nichole Figueroa is a 51 y.o. (May 28, 1971) female who presents for follow up of RLE DVT. She was initially seen back in January for bilateral edema and R> L lower extremity discomfort. She was found to have RLE venous insufficiency and incidental finding of thrombus of her PT vein of indeterminate age. She was therefore initiated on Xarelto for 3 month course.  ? ?She returns today for her 3 month follow up with venous DVT duplex. She reports she never took her Xarelto because her insurance did not cover it and was unable to afford cost. She reports no new discomfort, no swelling, no discoloration. She previously had right meniscus tear so she still is having some knee discomfort but this has been present for a while and is unchanged. She has not been elevating or wearing compression stockings. ? ?The pt is not on a statin for cholesterol management.  ?The pt is not on a daily aspirin.   Other AC:  Xarelto  ?The pt is not on medication for hypertension.   ?The pt is not diabetic.   ?Tobacco hx:  never ? ?Past Medical History:  ?Diagnosis Date  ? Kidney stone   ? ? ?Past Surgical History:  ?Procedure Laterality Date  ? ABDOMINAL HYSTERECTOMY N/A 2003  ? ? ?Social History  ? ?Socioeconomic History  ? Marital status: Widowed  ?  Spouse name: Not on file  ? Number of children: Not on file  ? Years of education: Not on file  ? Highest education level: Not on file  ?Occupational History  ? Not on file  ?Tobacco Use  ? Smoking status: Never  ? Smokeless tobacco: Never  ?Substance and Sexual Activity  ? Alcohol use: No  ? Drug use: Not on file  ? Sexual activity: Not on file  ?Other Topics Concern  ? Not on file  ?Social History Narrative  ? Not on file  ? ?Social Determinants of Health  ? ?Financial Resource Strain: Not on file  ?Food Insecurity: Not on file  ?Transportation Needs: Not on file  ?Physical Activity: Not on file  ?Stress: Not on  file  ?Social Connections: Not on file  ?Intimate Partner Violence: Not on file  ? ?No family history on file. ? ?Current Outpatient Medications  ?Medication Sig Dispense Refill  ? omeprazole (PRILOSEC OTC) 20 MG tablet Take by mouth.    ? rivaroxaban (XARELTO) 20 MG TABS tablet Take 1 tablet (20 mg total) by mouth daily with supper. (Patient not taking: Reported on 03/26/2022) 30 tablet 1  ? RIVAROXABAN (XARELTO) VTE STARTER PACK (15 & 20 MG) Follow package directions: Take one '15mg'$  tablet by mouth twice a day. On day 22, switch to one '20mg'$  tablet once a day. Take with food. (Patient not taking: Reported on 03/26/2022) 51 each 0  ? ?No current facility-administered medications for this visit.  ? ? ?Allergies  ?Allergen Reactions  ? Codeine Nausea And Vomiting  ? Hydrocodone Nausea And Vomiting  ? Hydrocodone-Acetaminophen   ?  Other reaction(s): nausea  ? Percocet [Oxycodone-Acetaminophen] Itching and Nausea And Vomiting  ? Tramadol Hives  ? ? ? ?REVIEW OF SYSTEMS:  ?'[X]'$  denotes positive finding, '[ ]'$  denotes negative finding ?Cardiac  Comments:  ?Chest pain or chest pressure:    ?Shortness of breath upon exertion:    ?Short of breath when lying flat:    ?Irregular heart rhythm:    ?    ?  Vascular    ?Pain in calf, thigh, or hip brought on by ambulation:    ?Pain in feet at night that wakes you up from your sleep:     ?Blood clot in your veins:    ?Leg swelling:     ?    ?Pulmonary    ?Oxygen at home:    ?Productive cough:     ?Wheezing:     ?    ?Neurologic    ?Sudden weakness in arms or legs:     ?Sudden numbness in arms or legs:     ?Sudden onset of difficulty speaking or slurred speech:    ?Temporary loss of vision in one eye:     ?Problems with dizziness:     ?    ?Gastrointestinal    ?Blood in stool:     ?Vomited blood:     ?    ?Genitourinary    ?Burning when urinating:     ?Blood in urine:    ?    ?Psychiatric    ?Major depression:     ?    ?Hematologic    ?Bleeding problems:    ?Problems with blood clotting too  easily:    ?    ?Skin    ?Rashes or ulcers:    ?    ?Constitutional    ?Fever or chills:    ? ? ?PHYSICAL EXAMINATION: ? ?Vitals:  ? 03/26/22 1402  ?BP: 114/75  ?Pulse: 76  ?Temp: 97.9 ?F (36.6 ?C)  ?Weight: 198 lb 8 oz (90 kg)  ?Height: '5\' 4"'$  (1.626 m)  ? ? ?General:  WDWN in NAD; vital signs documented above ?Gait: Normal ?HENT: WNL, normocephalic ?Pulmonary: normal non-labored breathing , without  wheezing ?Cardiac: regular HR, without  Murmurs without carotid bruit ?Abdomen: soft, NT, no masses ?Vascular Exam/Pulses: ? Right Left  ?Radial 2+ (normal) 2+ (normal)  ?Femoral 2+ (normal) 2+ (normal)  ?Popliteal 2+ (normal) 2+ (normal)  ?DP 2+ (normal) 2+ (normal)  ?PT 1+ (weak) 1+ (weak)  ? ?Extremities: without ischemic changes, without Gangrene , without cellulitis; without open wounds;  ?Musculoskeletal: no muscle wasting or atrophy  ?Neurologic: A&O X 3;  No focal weakness or paresthesias are detected ?Psychiatric:  The pt has Normal affect. ? ? 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 Distally, one of the two is thrombosed.  ?- No evidence of superficial vein thrombosis.  ? ? ?ASSESSMENT/PLAN:: 51 y.o. female here for follow up for RLE DVT. 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 No acute DVT. No SVT. She never started Xarelto due to cost and lack of insurance coverage. At this time would not recommend starting her on an [facility] with the chronicity of her DVT. She reports no new symptoms. She is not having any pain or swelling in her leg. No signs of any phlegmasia. I have encouraged her to elevate her legs daily. Also I had her measured today and have recommended knee high 15-20 mmHg compression stockings. Her prior duplex did show venous insufficiency of her GSV. ?- Encourage exercise  ?- Recommend taking baby Aspirin 81 mg daily ?- She can follow up as needed if she has new or  concerning symptoms ? ? ?Karoline Caldwell, PA-C ?[facility] ?[phone] ? ?Clinic MD:   [place] ?

## 2022-03-26 ENCOUNTER — Encounter: Payer: Self-pay | Admitting: Physician Assistant

## 2022-03-26 ENCOUNTER — Ambulatory Visit: Payer: Managed Care, Other (non HMO) | Admitting: Physician Assistant

## 2022-03-26 ENCOUNTER — Ambulatory Visit (HOSPITAL_COMMUNITY)
Admission: RE | Admit: 2022-03-26 | Discharge: 2022-03-26 | Disposition: A | Discharge status: Home / Self Care | Payer: Managed Care, Other (non HMO) | Source: Ambulatory Visit | Attending: Physician Assistant | Admitting: Physician Assistant

## 2022-03-26 VITALS — BP 114/75 | HR 76 | Temp 97.9°F | Ht 64.0 in | Wt 198.5 lb

## 2022-03-26 DIAGNOSIS — I872 Venous insufficiency (chronic) (peripheral): Secondary | ICD-10-CM

## 2022-03-26 DIAGNOSIS — I82441 Acute embolism and thrombosis of right tibial vein: Secondary | ICD-10-CM | POA: Diagnosis present

## 2022-03-26 DIAGNOSIS — M7989 Other specified soft tissue disorders: Secondary | ICD-10-CM

## 2022-06-30 ENCOUNTER — Emergency Department (INDEPENDENT_AMBULATORY_CARE_PROVIDER_SITE_OTHER)
Admission: EM | Admit: 2022-06-30 | Discharge: 2022-06-30 | Disposition: A | Discharge status: Home / Self Care | Payer: Commercial Managed Care - HMO | Source: Home / Self Care

## 2022-06-30 ENCOUNTER — Encounter: Payer: Self-pay | Admitting: Emergency Medicine

## 2022-06-30 ENCOUNTER — Other Ambulatory Visit: Payer: Self-pay

## 2022-06-30 DIAGNOSIS — N3001 Acute cystitis with hematuria: Secondary | ICD-10-CM | POA: Diagnosis not present

## 2022-06-30 LAB — POCT URINALYSIS DIP (MANUAL ENTRY)
Bilirubin, UA: NEGATIVE
Glucose, UA: NEGATIVE mg/dL
Ketones, POC UA: NEGATIVE mg/dL
Nitrite, UA: POSITIVE — AB
Protein Ur, POC: NEGATIVE mg/dL
Spec Grav, UA: >=1.03 — AB (ref 1.010–1.025)
Urobilinogen, UA: 0.2 E.U./dL
pH, UA: 6.5 (ref 5.0–8.0)

## 2022-06-30 MED ORDER — CIPROFLOXACIN HCL 250 MG PO TABS
250.0000 mg | ORAL_TABLET | Freq: Two times a day (BID) | ORAL | 0 refills | Status: AC
Start: 2022-06-30 — End: 2022-07-07

## 2022-06-30 NOTE — Discharge Instructions (Addendum)
Instructed patient to take medication as directed with food completion.  Encouraged patient increase daily water intake while taking this medication.  Advised patient we will follow-up with urine culture results once received.

## 2022-06-30 NOTE — ED Triage Notes (Signed)
Dysuria x 4 days

## 2022-06-30 NOTE — ED Provider Notes (Signed)
[name]    CSN: [id number] Arrival date & time: 06/30/22  1950      History   Chief Complaint Chief Complaint  Patient presents with   Dysuria    HPI Nichole Figueroa is a 51 y.o. female.   HPI PMH significant for renal hemorrhage of left kidney, anxiety, and GERD without esophagitis.  Past Medical History:  Diagnosis Date   Kidney stone     Patient Active Problem List   Diagnosis Date Noted   Anxiety 12/26/2021   Gastro-esophageal reflux disease without esophagitis 12/26/2021   Iron deficiency anemia 12/26/2021   Severe major depression, single episode, without psychotic features ([place]) 12/26/2021   Lumbar pain 12/13/2021   Pain in joint of right knee 11/11/2021   Plantar fasciitis of right foot 02/09/2019   Osteoarthritis of right knee 06/04/2018   Pain in right foot 04/23/2018   Hemorrhage of left kidney 07/25/2016   Renal hemorrhage, left 07/25/2016    Past Surgical History:  Procedure Laterality Date   ABDOMINAL HYSTERECTOMY N/A 2003    OB History   No obstetric history on file.      Home Medications    Prior to Admission medications   Medication Sig Start Date End Date Taking? Authorizing Provider  ciprofloxacin (CIPRO) 250 MG tablet Take 1 tablet (250 mg total) by mouth every 12 (twelve) hours for 7 days. 06/30/22 07/07/22 Yes Eliezer Lofts, FNP    Family History Family History  Problem Relation Age of Onset   Hypertension Mother    Osteoporosis Mother    Stroke Father     Social History Social History   Tobacco Use   Smoking status: Never   Smokeless tobacco: Never  Vaping Use   Vaping Use: Never used  Substance Use Topics   Alcohol use: No   Drug use: Not Currently     Allergies   Codeine, Hydrocodone, Hydrocodone-acetaminophen, Percocet [oxycodone-acetaminophen], and Tramadol   Review of Systems Review of Systems  Genitourinary:  Positive for dysuria.  All other systems reviewed and are  negative.    Physical Exam Triage Vital Signs ED Triage Vitals  Enc Vitals Group     BP      Pulse      Resp      Temp      Temp src      SpO2      Weight      Height      Head Circumference      Peak Flow      Pain Score      Pain Loc      Pain Edu?      Excl. in [place]?    No data found.  Updated Vital Signs BP 126/84 (BP Location: Left Arm)   Pulse 96   Temp 99.2 F (37.3 C) (Oral)   Resp 18   Ht '5\' 4"'$  (1.626 m)   Wt 192 lb (87.1 kg)   LMP  (LMP Unknown) Comment: HCG neg  SpO2 99%   BMI 32.96 kg/m   Physical Exam Vitals and nursing note reviewed.  Constitutional:      Appearance: Normal appearance. She is normal weight.  HENT:     Head: Normocephalic and atraumatic.     Mouth/Throat:     Mouth: Mucous membranes are moist.     Pharynx: Oropharynx is clear.  Eyes:     Extraocular Movements: Extraocular movements intact.     Conjunctiva/sclera: Conjunctivae normal.  Pupils: Pupils are equal, round, and reactive to light.  Cardiovascular:     Pulses: Normal pulses.     Heart sounds: Normal heart sounds.  Pulmonary:     Effort: Pulmonary effort is normal.     Breath sounds: Normal breath sounds. No wheezing, rhonchi or rales.  Abdominal:     Tenderness: There is right CVA tenderness and left CVA tenderness.  Musculoskeletal:     Cervical back: Normal range of motion and neck supple.  Skin:    General: Skin is warm and dry.  Neurological:     General: No focal deficit present.     Mental Status: She is alert and oriented to person, place, and time. Mental status is at baseline.      UC Treatments / Results  Labs (all labs ordered are listed, but only abnormal results are displayed) Labs Reviewed  POCT URINALYSIS DIP (MANUAL ENTRY) - Abnormal; Notable for the following components:      Result Value   Color, UA other (*)    Clarity, UA cloudy (*)    Spec Grav, UA >=1.030 (*)    Blood, UA trace-intact (*)    Nitrite, UA Positive (*)     Leukocytes, UA Small (1+) (*)    All other components within normal limits  URINE CULTURE    EKG   Radiology No results found.  Procedures Procedures (including critical care time)  Medications Ordered in UC Medications - No data to display  Initial Impression / Assessment and Plan / UC Course  I have reviewed the triage vital signs and the nursing notes.  Pertinent labs & imaging results that were available during my care of the patient were reviewed by me and considered in my medical decision making (see chart for details).     MDM: 1.  Acute cystitis with hematuria-urine culture ordered, Rx'd Cipro. Instructed patient to take medication as directed with food completion.  Encouraged patient increase daily water intake while taking this medication.  Advised patient we will follow-up with urine culture results once received.  Patient discharged home, hemodynamically stable.  Final Clinical Impressions(s) / UC Diagnoses   Final diagnoses:  Acute cystitis with hematuria     Discharge Instructions      Instructed patient to take medication as directed with food completion.  Encouraged patient increase daily water intake while taking this medication.  Advised patient we will follow-up with urine culture results once received.     ED Prescriptions     Medication Sig Dispense Auth. Provider   ciprofloxacin (CIPRO) 250 MG tablet Take 1 tablet (250 mg total) by mouth every 12 (twelve) hours for 7 days. 14 tablet Eliezer Lofts, FNP      PDMP not reviewed this encounter.   Eliezer Lofts, [place] 06/30/22 2011

## 2022-07-02 ENCOUNTER — Telehealth: Payer: Self-pay | Admitting: Emergency Medicine

## 2022-07-02 ENCOUNTER — Ambulatory Visit: Payer: Self-pay

## 2022-07-02 DIAGNOSIS — B3731 Acute candidiasis of vulva and vagina: Secondary | ICD-10-CM

## 2022-07-02 MED ORDER — FLUCONAZOLE 150 MG PO TABS: ORAL_TABLET | ORAL | 0 refills | Status: DC

## 2022-07-02 NOTE — Telephone Encounter (Signed)
This RN returned call to Nichole Figueroa regarding symptoms of leg pain today. Pt also has complaints of a yeast infection. Chart review with [name]. Pt states her legs are not swollen, they " just hurt. I think I pulled a muscle walking up and down my fiance's steps and I am not used to all the steps" Diflucan sent in per [name]. Pt can go to the ED today to rule out a DVT (pt does not think it is a DVT at this time). Pt is going to see how she feels over night after taking some OTC pain medicine & will follow up at [facility] in the am if it has not improved. Pt knows to come early in case an ultrasound is needed. Pt verbalized an understanding that she should go to ED if she has SOB or increased swelling with warmth to her leg. No other questions at this time.

## 2022-07-04 LAB — URINE CULTURE
MICRO NUMBER:: 13631101
SPECIMEN QUALITY:: ADEQUATE

## 2022-09-29 ENCOUNTER — Other Ambulatory Visit: Payer: Self-pay | Admitting: Physician Assistant

## 2022-09-29 ENCOUNTER — Ambulatory Visit
Admission: RE | Admit: 2022-09-29 | Discharge: 2022-09-29 | Disposition: A | Discharge status: Home / Self Care | Payer: Commercial Managed Care - HMO | Source: Ambulatory Visit | Attending: Physician Assistant | Admitting: Physician Assistant

## 2022-09-29 DIAGNOSIS — J40 Bronchitis, not specified as acute or chronic: Secondary | ICD-10-CM

## 2022-10-15 ENCOUNTER — Other Ambulatory Visit: Payer: Self-pay | Admitting: Physician Assistant

## 2022-10-15 DIAGNOSIS — R918 Other nonspecific abnormal finding of lung field: Secondary | ICD-10-CM

## 2022-10-23 ENCOUNTER — Ambulatory Visit: Payer: Commercial Managed Care - HMO | Admitting: Pulmonary Disease

## 2022-10-23 ENCOUNTER — Encounter: Payer: Self-pay | Admitting: Pulmonary Disease

## 2022-10-23 VITALS — BP 112/74 | HR 75 | Temp 98.0°F | Ht 64.0 in | Wt 193.6 lb

## 2022-10-23 DIAGNOSIS — R0600 Dyspnea, unspecified: Secondary | ICD-10-CM | POA: Diagnosis not present

## 2022-10-23 DIAGNOSIS — U071 COVID-19: Secondary | ICD-10-CM | POA: Diagnosis not present

## 2022-10-23 DIAGNOSIS — I2699 Other pulmonary embolism without acute cor pulmonale: Secondary | ICD-10-CM | POA: Diagnosis not present

## 2022-10-23 DIAGNOSIS — R5382 Chronic fatigue, unspecified: Secondary | ICD-10-CM

## 2022-10-23 DIAGNOSIS — R911 Solitary pulmonary nodule: Secondary | ICD-10-CM | POA: Diagnosis not present

## 2022-10-23 DIAGNOSIS — R21 Rash and other nonspecific skin eruption: Secondary | ICD-10-CM

## 2022-10-23 LAB — D-DIMER, QUANTITATIVE: D-Dimer, Quant: 0.57 mcg/mL FEU — ABNORMAL HIGH (ref <0.50)

## 2022-10-23 NOTE — Patient Instructions (Signed)
For shortness of breath: Lung function test Ambulatory exam oximetry monitoring in the office Echocardiogram CBC, BNP  For orthopnea: Echocardiogram  New onset rash: ANA, double-stranded DNA, ANCA  Fatigue: TSH Consider sleep study next visit  We will see you back in about 4 weeks to go over these results or sooner if needed

## 2022-10-23 NOTE — Progress Notes (Signed)
Synopsis: Referred in 10/2022 for wheezing and dyspnea after having COVID.  Subjective:   PATIENT ID: Nichole Figueroa GENDER: female DOB: 06/16/1971, MRN: [id number]   HPI  Chief Complaint  Patient presents with   Consult    New PT visit Covid 4 times (most recent in September), fatigue, wheezing, SOB   Nichole Figueroa is here to see me because she has been experiencing fatigue, dyspane and wheezing after having COVID.  She had COVID in 2020, 2021, 2022, and again in 2023 (just a few weeks ago).  She says taht this go round has been the worst go round by far because she is very short of breath, very fatigued, and is having a lot of forgetfulness.  She wasn't hospitalized, but she was in the PCP office regularly.  She isn't sleeping very well.  However she is waking up gasping for air in the middle of the night.  She says that she isn't anxious or having chest congestion when that happens. Still has dry cough, some chills, no fever.  She says that she feels cold.  She has been vaccinated against COVID and has had several boosters.  Has a history of bleeding from her left kidney with kidney stones, no pain to suggest that is recurring again.    She has been treated with prednisone, three different inhalers, amoxicllin, azithromycin.    No prior lung problems.    She has noticed a rash on her skin in the last few months as well.   Past Medical History:  Diagnosis Date   Kidney stone      Family History  Problem Relation Age of Onset   Hypertension Mother    Osteoporosis Mother    Stroke Father      Social History   Socioeconomic History   Marital status: Widowed    Spouse name: Not on file   Number of children: Not on file   Years of education: Not on file   Highest education level: Not on file  Occupational History   Not on file  Tobacco Use   Smoking status: Never   Smokeless tobacco: Never  Vaping Use   Vaping Use: Never used  Substance and Sexual Activity   Alcohol use:  No   Drug use: Not Currently   Sexual activity: Not on file  Other Topics Concern   Not on file  Social History Narrative   Not on file   Social Determinants of Health   Financial Resource Strain: Not on file  Food Insecurity: Not on file  Transportation Needs: Not on file  Physical Activity: Not on file  Stress: Not on file  Social Connections: Not on file  Intimate Partner Violence: Not on file     Allergies  Allergen Reactions   Codeine Nausea And Vomiting   Hydrocodone Nausea And Vomiting   Hydrocodone-Acetaminophen     Other reaction(s): nausea   Percocet [Oxycodone-Acetaminophen] Itching and Nausea And Vomiting   Tramadol Hives     Outpatient Medications Prior to Visit  Medication Sig Dispense Refill   albuterol (VENTOLIN HFA) 108 (90 Base) MCG/ACT inhaler SMARTSIG:2 Puff(s) By Mouth Every 4 Hours PRN     FLOVENT HFA 110 MCG/ACT inhaler SMARTSIG:2 Puff(s) By Mouth Twice Daily     gabapentin (NEURONTIN) 300 MG capsule TAKE 1 CAPSULE BY MOUTH IN THE EVENING AS NEEDED     lidocaine (LIDODERM) 5 % APPLY 1 PATCH BY TOPICAL ROUTE ONCE DAILY (MAY WEAR UP TO 12HOURS.)  fluconazole (DIFLUCAN) 150 MG tablet Take one tablet now, repeat in 72 hours if symptoms have not resolved . 2 tablet 0   No facility-administered medications prior to visit.    Review of Systems  Constitutional:  Positive for malaise/fatigue. Negative for chills, fever and weight loss.  HENT:  Negative for congestion, nosebleeds, sinus pain and sore throat.   Eyes:  Negative for photophobia, pain and discharge.  Respiratory:  Positive for cough and shortness of breath. Negative for hemoptysis, sputum production and wheezing.   Cardiovascular:  Positive for orthopnea. Negative for chest pain, palpitations and leg swelling.  Gastrointestinal:  Negative for abdominal pain, constipation, diarrhea, nausea and vomiting.  Genitourinary:  Negative for dysuria, frequency, hematuria and urgency.   Musculoskeletal:  Negative for back pain, joint pain, myalgias and neck pain.  Skin:  Positive for rash. Negative for itching.  Neurological:  Negative for tingling, tremors, sensory change, speech change, focal weakness, seizures, weakness and headaches.  Psychiatric/Behavioral:  Negative for memory loss, substance abuse and suicidal ideas. The patient is not nervous/anxious.       Objective:  Physical Exam   Vitals:   10/23/22 1441  BP: 112/74  Pulse: 75  Temp: 98 F (36.7 C)  TempSrc: Oral  SpO2: 100%  Weight: 193 lb 9.6 oz (87.8 kg)  Height: _0  (1.626 m)    Gen: well appearing, no acute distress HENT: NCAT, OP clear, neck supple without masses Eyes: PERRL, EOMi Lymph: no cervical lymphadenopathy PULM: CTA B CV: RRR, no mgr, no JVD GI: BS+, soft, nontender, no hsm Derm: Telangiectasias on face and on chest, punctate purple/red tiny lesions on palms of hands, splinter hemorrhages MSK: normal bulk and tone Neuro: A&Ox4, CN II-XII intact, strength 5/5 in all 4 extremities Psyche: normal mood and affect       CBC    Component Value Date/Time   WBC 8.0 11/18/2020 0602   RBC 4.08 11/18/2020 0602   HGB 7.6 (L) 11/18/2020 0602   HCT 27.8 (L) 11/18/2020 0602   PLT 338 11/18/2020 0602   MCV 68.1 (L) 11/18/2020 0602   MCH 18.6 (L) 11/18/2020 0602   MCHC 27.3 (L) 11/18/2020 0602   RDW 21.9 (H) 11/18/2020 0602   LYMPHSABS 1.8 08/03/2016 2029   MONOABS 0.4 08/03/2016 2029   EOSABS 0.1 08/03/2016 2029   BASOSABS 0.0 08/03/2016 2029     Chest imaging: October 2023 chest x-ray images independently reviewed showing increased AP diameter, flattening of the diaphragms, pulmonary nodule right upper lobe.  PFT:  Labs:  Path:  Echo:  Heart Catheterization:       Assessment & Plan:   Chronic fatigue - Plan: TSH  PE (pulmonary thromboembolism) ([place]) - Plan: D-Dimer, Quantitative  Pulmonary nodule - Plan: CT Chest Wo Contrast  Dyspnea due to COVID-19  - Plan: CBC w/Diff, B Nat Peptide, ECHOCARDIOGRAM COMPLETE, Pulmonary function test  Rash associated with COVID-19 - Plan: Antinuclear Antib (ANA), ANCA Screen Reflex Titer, Anti-DNA antibody, double-stranded  Discussion: Nichole Figueroa presents with several weeks of fatigue, dyspnea, cough and orthopnea with an unexplained rash after having COVID for the fourth time.  The differential diagnosis for these symptoms is quite broad and includes endocrine abnormalities such as hypothyroidism, cardiac abnormality such as this heart failure, pulmonary embolism, or sleep apnea.  She also has a pulmonary nodule.  At this time it is difficult to say exactly what is causing this so I think we should start with a fairly broad assessment of heart and lung  abnormalities.  Plan: For shortness of breath: Lung function test Ambulatory exam oximetry monitoring in the office Echocardiogram CBC, BNP  For orthopnea: Echocardiogram  New onset rash: ANA, double-stranded DNA, ANCA  Fatigue: TSH Consider sleep study next visit  We will see you back in about 4 weeks to go over these results or sooner if needed Immunizations: Immunization History  Administered Date(s) Administered   DTaP 03/27/1970, 05/11/1970, 06/29/1970, 07/22/1971   Hepatitis B, PED/ADOLESCENT 12/11/2000, 01/14/2001, 07/28/2001   Hepatitis B, adult 03/11/2018   Influenza Split 10/25/2021   Influenza-Unspecified 10/08/2017, 10/22/2021   MMR 11/28/1970, 01/08/1971, 07/22/1971   OPV 08/16/1970, 10/29/1970, 07/22/1971, 07/28/1985   PFIZER(Purple Top)SARS-COV-2 Vaccination 02/24/2020, 03/21/2020   PNEUMOCOCCAL CONJUGATE-20 11/01/2021   PPD Test 02/06/2012, 08/11/2017, 03/11/2018   Pfizer Covid-19 Vaccine Bivalent Booster 27yr & up 11/01/2021   Pneumococcal Polysaccharide-23 01/14/2001   Td 09/12/1999   Tdap 05/28/2020   Varicella 12/11/2000     Current Outpatient Medications:    albuterol (VENTOLIN HFA) 108 (90 Base) MCG/ACT inhaler,  SMARTSIG:2 Puff(s) By Mouth Every 4 Hours PRN, Disp: , Rfl:    FLOVENT HFA 110 MCG/ACT inhaler, SMARTSIG:2 Puff(s) By Mouth Twice Daily, Disp: , Rfl:    gabapentin (NEURONTIN) 300 MG capsule, TAKE 1 CAPSULE BY MOUTH IN THE EVENING AS NEEDED, Disp: , Rfl:    lidocaine (LIDODERM) 5 %, APPLY 1 PATCH BY TOPICAL ROUTE ONCE DAILY (MAY WEAR UP TO 12HOURS.), Disp: , Rfl:

## 2022-10-24 ENCOUNTER — Telehealth: Payer: Self-pay | Admitting: Pulmonary Disease

## 2022-10-24 LAB — CBC WITH DIFFERENTIAL/PLATELET
Basophils Relative: 0 % (ref 0.0–3.0)
Eosinophils Relative: 0 % (ref 0.0–5.0)
HCT: 26.9 % — ABNORMAL LOW (ref 36.0–46.0)
Hemoglobin: 8.2 g/dL — ABNORMAL LOW (ref 12.0–15.0)
Lymphocytes Relative: 31 % (ref 12.0–46.0)
MCHC: 30.4 g/dL (ref 30.0–36.0)
MCV: 62.9 fl — ABNORMAL LOW (ref 78.0–100.0)
Monocytes Relative: 4 % (ref 3.0–12.0)
Neutrophils Relative %: 65 % (ref 43.0–77.0)
Platelets: 413 10*3/uL — ABNORMAL HIGH (ref 150.0–400.0)
RBC: 4.28 Mil/uL (ref 3.87–5.11)
RDW: 20.8 % — ABNORMAL HIGH (ref 11.5–15.5)
WBC: 6.3 10*3/uL (ref 4.0–10.5)

## 2022-10-24 LAB — BRAIN NATRIURETIC PEPTIDE: Pro B Natriuretic peptide (BNP): 87 pg/mL (ref 0.0–100.0)

## 2022-10-24 LAB — TSH: TSH: 1.08 u[IU]/mL (ref 0.35–5.50)

## 2022-10-24 NOTE — Telephone Encounter (Signed)
FYI - You placed order for pt to have CT without contrast.  Pt is already scheduled for CT WITH contrast on 11/7 by Caleb Turmel.

## 2022-10-27 NOTE — Telephone Encounter (Signed)
Cancelled CT BQ ordered

## 2022-10-28 ENCOUNTER — Ambulatory Visit
Admission: RE | Admit: 2022-10-28 | Discharge: 2022-10-28 | Disposition: A | Discharge status: Home / Self Care | Payer: Commercial Managed Care - HMO | Source: Ambulatory Visit | Attending: Physician Assistant | Admitting: Physician Assistant

## 2022-10-28 ENCOUNTER — Other Ambulatory Visit: Payer: Commercial Managed Care - HMO

## 2022-10-28 DIAGNOSIS — R918 Other nonspecific abnormal finding of lung field: Secondary | ICD-10-CM

## 2022-10-28 LAB — ANCA SCREEN W REFLEX TITER: ANCA SCREEN: NEGATIVE

## 2022-10-28 LAB — ANA: Anti Nuclear Antibody (ANA): NEGATIVE

## 2022-10-28 LAB — ANTI-DNA ANTIBODY, DOUBLE-STRANDED: ds DNA Ab: <1 IU/mL

## 2022-10-28 MED ORDER — IOPAMIDOL (ISOVUE-300) INJECTION 61%
75.0000 mL | Freq: Once | INTRAVENOUS | Status: AC | PRN
  Administered 2022-10-28: 75 mL via INTRAVENOUS

## 2022-10-29 ENCOUNTER — Ambulatory Visit (HOSPITAL_COMMUNITY): Payer: Commercial Managed Care - HMO | Attending: Pulmonary Disease

## 2022-10-29 DIAGNOSIS — U071 COVID-19: Secondary | ICD-10-CM | POA: Diagnosis not present

## 2022-10-29 DIAGNOSIS — I517 Cardiomegaly: Secondary | ICD-10-CM

## 2022-10-29 DIAGNOSIS — R0600 Dyspnea, unspecified: Secondary | ICD-10-CM | POA: Diagnosis not present

## 2022-10-29 LAB — ECHOCARDIOGRAM COMPLETE
Area-P 1/2: 3.53 cm2
S' Lateral: 2.9 cm

## 2022-10-31 NOTE — Progress Notes (Signed)
Telephone call returned to patient to answer questions 1) results patient was informed the ordering provider is responsible for results. 2) another MD had ordered CT chest W/out contrrast informed patient to let that MD know that she had her CT of chest w/ cm by another provider on 10/28/22 and did not need another scan of chest/MMS

## 2022-11-05 ENCOUNTER — Telehealth: Payer: Self-pay | Admitting: Pulmonary Disease

## 2022-11-05 NOTE — Telephone Encounter (Signed)
ATC patient. LVMTCB. 

## 2022-11-24 ENCOUNTER — Other Ambulatory Visit: Payer: Commercial Managed Care - HMO

## 2022-12-11 ENCOUNTER — Ambulatory Visit (INDEPENDENT_AMBULATORY_CARE_PROVIDER_SITE_OTHER): Payer: Commercial Managed Care - HMO | Admitting: Pulmonary Disease

## 2022-12-11 ENCOUNTER — Encounter: Payer: Self-pay | Admitting: Pulmonary Disease

## 2022-12-11 ENCOUNTER — Ambulatory Visit: Payer: Commercial Managed Care - HMO | Admitting: Pulmonary Disease

## 2022-12-11 VITALS — BP 132/76 | HR 76 | Temp 97.9°F | Ht 64.0 in | Wt 194.8 lb

## 2022-12-11 DIAGNOSIS — R0600 Dyspnea, unspecified: Secondary | ICD-10-CM

## 2022-12-11 DIAGNOSIS — U071 COVID-19: Secondary | ICD-10-CM

## 2022-12-11 DIAGNOSIS — R040 Epistaxis: Secondary | ICD-10-CM

## 2022-12-11 DIAGNOSIS — I780 Hereditary hemorrhagic telangiectasia: Secondary | ICD-10-CM | POA: Diagnosis not present

## 2022-12-11 LAB — PULMONARY FUNCTION TEST
DL/VA % pred: 92 %
DL/VA: 3.95 ml/min/mmHg/L
DLCO cor % pred: 76 %
DLCO cor: 15.93 ml/min/mmHg
DLCO unc % pred: 60 %
DLCO unc: 12.63 ml/min/mmHg
FEF 25-75 Post: 3.9 L/sec
FEF 25-75 Pre: 1.53 L/sec
FEF2575-%Change-Post: 154 %
FEF2575-%Pred-Post: 145 %
FEF2575-%Pred-Pre: 57 %
FEV1-%Change-Post: 32 %
FEV1-%Pred-Post: 85 %
FEV1-%Pred-Pre: 64 %
FEV1-Post: 2.36 L
FEV1-Pre: 1.78 L
FEV1FVC-%Change-Post: 9 %
FEV1FVC-%Pred-Pre: 98 %
FEV6-%Change-Post: 24 %
FEV6-%Pred-Post: 80 %
FEV6-%Pred-Pre: 65 %
FEV6-Post: 2.75 L
FEV6-Pre: 2.21 L
FEV6FVC-%Change-Post: 3 %
FEV6FVC-%Pred-Post: 102 %
FEV6FVC-%Pred-Pre: 99 %
FVC-%Change-Post: 20 %
FVC-%Pred-Post: 78 %
FVC-%Pred-Pre: 65 %
FVC-Post: 2.75 L
FVC-Pre: 2.28 L
Post FEV1/FVC ratio: 86 %
Post FEV6/FVC ratio: 100 %
Pre FEV1/FVC ratio: 78 %
Pre FEV6/FVC Ratio: 97 %
RV % pred: 70 %
RV: 1.29 L
TLC % pred: 79 %
TLC: 4.02 L

## 2022-12-11 MED ORDER — TRELEGY ELLIPTA 100-62.5-25 MCG/ACT IN AEPB: 1.0000 | INHALATION_SPRAY | Freq: Every day | RESPIRATORY_TRACT | 0 refills | Status: DC

## 2022-12-11 NOTE — Progress Notes (Signed)
PFT done today. 

## 2022-12-11 NOTE — Addendum Note (Signed)
Addended by: Fritzi Mandes D on: 12/11/2022 04:23 PM   Modules accepted: Orders

## 2022-12-11 NOTE — Addendum Note (Signed)
Addended byOralia Rud M on: 12/11/2022 04:54 PM   Modules accepted: Orders

## 2022-12-11 NOTE — Patient Instructions (Signed)
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

## 2022-12-11 NOTE — Progress Notes (Signed)
Synopsis: Referred in 10/2022 for wheezing and dyspnea after having COVID.  Subjective:   PATIENT ID: Nichole Figueroa GENDER: female DOB: [birth year], MRN: [id number]   HPI  Chief Complaint  Patient presents with   Follow-up    SOB persistent.  No change from last OV.  Fatigue with walking.    Shannin says that she still has a hard time breathing when exerting herself, some dyspnea when laying flat.  She has never been told that she had asthma in the past.  She doesn't think that albuterol made much of a difference in the past.   She says that she's had severe nose bleeds in the past.  She says this happens several times a week.  She says that she's never had GI bleeding.  She had a colonoscopy 2 years and upper EGD.  She says she has multiple family members with Osler Weber Rendu syndrome  Past Medical History:  Diagnosis Date   Kidney stone        Review of Systems  Constitutional:  Negative for chills, fever, malaise/fatigue and weight loss.  HENT:  Positive for nosebleeds. Negative for congestion, sinus pain and sore throat.   Respiratory:  Positive for shortness of breath. Negative for cough and sputum production.   Cardiovascular:  Negative for chest pain and leg swelling.      Objective:  Physical Exam   Vitals:   12/11/22 1304  BP: 132/76  Pulse: 76  Temp: 97.9 F (36.6 C)  TempSrc: Oral  SpO2: 100%  Weight: 194 lb 12.8 oz (88.4 kg)  Height: _0  (1.626 m)    Gen: well appearing HENT: OP clear, neck supple PULM: CTA B, normal effort  CV: RRR, no mgr GI: BS+, soft, nontender Derm: multiple telangiectasias, one on her lower lip Psyche: normal mood and affect      CBC    Component Value Date/Time   WBC 6.3 10/23/2022 1534   RBC 4.28 10/23/2022 1534   HGB 8.2 Repeated and verified X2. (L) 10/23/2022 1534   HCT 26.9 (L) 10/23/2022 1534   PLT 413.0 (H) 10/23/2022 1534   MCV 62.9 (L) 10/23/2022 1534   MCH 18.6 (L) 11/18/2020 0602   MCHC 30.4  10/23/2022 1534   RDW 20.8 (H) 10/23/2022 1534   LYMPHSABS 1.8 08/03/2016 2029   MONOABS 0.4 08/03/2016 2029   EOSABS 0.1 08/03/2016 2029   BASOSABS 0.0 08/03/2016 2029    Chest imaging: October 2023 chest x-ray images independently reviewed showing increased AP diameter, flattening of the diaphragms, pulmonary nodule right upper lobe. November 2023 CT chest with contrast images reviewed showing a 3 mm solid pulmonary nodule right upper lobe, heterogeneous appearance of the liver, very mild patchy groundglass posterior, peripheral based  PFT: 12/11/2022 PFT> ratio 86%, FEV1 2.36 L 85% predicted, 32% change with bronchodilator, total lung capacity 4.02 L 79% predicted, DLCO 12.6 60% predicted  Labs: November 2023 hemoglobin 8.2 g/dL, MCV 62.9, ANA negative, double-stranded DNA negative, ANCA screen negative D-dimer slightly positive, TSH normal  Path:  Echo:  Heart Catheterization:       Assessment & Plan:   Dyspnea, unspecified type  Epistaxis  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  This is the reason for her repeated nosebleeds which is likely the cause of her anemia.  We need to get her into ENT to evaluate further.  She also needs to have a CT angiogram of her chest to  make sure there is no evidence of pulmonary vascular malformations.  She did have some hyperreactivity with albuterol.  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 CT angiogram of the chest I will ask my colleagues at [facility] if there are any local experts in the state for this condition  Follow-up in 2 to 3 weeks with me  Immunizations: Immunization History   Administered Date(s) Administered   DTaP 03/27/1970, 05/11/1970, 06/29/1970, 07/22/1971   Hepatitis B, PED/ADOLESCENT 12/11/2000, 01/14/2001, 07/28/2001   Hepatitis B, adult 03/11/2018   Influenza Split 10/25/2021   Influenza-Unspecified 10/08/2017, 10/22/2021   MMR 11/28/1970, 01/08/1971, 07/22/1971   OPV 08/16/1970, 10/29/1970, 07/22/1971, 07/28/1985   PFIZER(Purple Top)SARS-COV-2 Vaccination 02/24/2020, 03/21/2020   PNEUMOCOCCAL CONJUGATE-20 11/01/2021   PPD Test 02/06/2012, 08/11/2017, 03/11/2018   Pfizer Covid-19 Vaccine Bivalent Booster 31yr & up 11/01/2021   Pneumococcal Polysaccharide-23 01/14/2001   Td 09/12/1999   Tdap 05/28/2020   Varicella 12/11/2000     Current Outpatient Medications:    albuterol (VENTOLIN HFA) 108 (90 Base) MCG/ACT inhaler, SMARTSIG:2 Puff(s) By Mouth Every 4 Hours PRN, Disp: , Rfl:    FLOVENT HFA 110 MCG/ACT inhaler, SMARTSIG:2 Puff(s) By Mouth Twice Daily, Disp: , Rfl:    lidocaine (LIDODERM) 5 %, APPLY 1 PATCH BY TOPICAL ROUTE ONCE DAILY (MAY WEAR UP TO 12HOURS.), Disp: , Rfl:    meloxicam (MOBIC) 7.5 MG tablet, Take 7.5 mg by mouth daily as needed for pain., Disp: , Rfl:    gabapentin (NEURONTIN) 300 MG capsule, TAKE 1 CAPSULE BY MOUTH IN THE EVENING AS NEEDED (Patient not taking: Reported on 12/11/2022), Disp: , Rfl:

## 2022-12-12 ENCOUNTER — Other Ambulatory Visit: Payer: Commercial Managed Care - HMO

## 2022-12-12 ENCOUNTER — Other Ambulatory Visit: Payer: Self-pay

## 2022-12-12 DIAGNOSIS — I780 Hereditary hemorrhagic telangiectasia: Secondary | ICD-10-CM

## 2022-12-17 ENCOUNTER — Ambulatory Visit
Admission: RE | Admit: 2022-12-17 | Discharge: 2022-12-17 | Disposition: A | Discharge status: Home / Self Care | Payer: Commercial Managed Care - HMO | Source: Ambulatory Visit | Attending: Pulmonary Disease | Admitting: Pulmonary Disease

## 2022-12-17 DIAGNOSIS — R0600 Dyspnea, unspecified: Secondary | ICD-10-CM

## 2022-12-17 MED ORDER — IOPAMIDOL (ISOVUE-370) INJECTION 76%
75.0000 mL | Freq: Once | INTRAVENOUS | Status: AC | PRN
Start: 2022-12-17 — End: 2022-12-17
  Administered 2022-12-17: 75 mL via INTRAVENOUS

## 2022-12-25 ENCOUNTER — Other Ambulatory Visit: Payer: Self-pay | Admitting: Pulmonary Disease

## 2022-12-25 ENCOUNTER — Encounter: Payer: Self-pay | Admitting: Pulmonary Disease

## 2022-12-25 ENCOUNTER — Ambulatory Visit (INDEPENDENT_AMBULATORY_CARE_PROVIDER_SITE_OTHER): Payer: Medicaid Other | Admitting: Pulmonary Disease

## 2022-12-25 VITALS — BP 112/68 | HR 74 | Temp 98.2°F | Ht 64.0 in | Wt 197.0 lb

## 2022-12-25 DIAGNOSIS — R040 Epistaxis: Secondary | ICD-10-CM | POA: Diagnosis not present

## 2022-12-25 DIAGNOSIS — D500 Iron deficiency anemia secondary to blood loss (chronic): Secondary | ICD-10-CM | POA: Diagnosis not present

## 2022-12-25 DIAGNOSIS — I780 Hereditary hemorrhagic telangiectasia: Secondary | ICD-10-CM | POA: Diagnosis not present

## 2022-12-25 LAB — CBC WITH DIFFERENTIAL/PLATELET
Basophils Absolute: 0 10*3/uL (ref 0.0–0.1)
Basophils Relative: 0.8 % (ref 0.0–3.0)
Eosinophils Absolute: 0.2 10*3/uL (ref 0.0–0.7)
Eosinophils Relative: 2.9 % (ref 0.0–5.0)
HCT: 25.6 % — ABNORMAL LOW (ref 36.0–46.0)
Hemoglobin: 7.8 g/dL — CL (ref 12.0–15.0)
Lymphocytes Relative: 31.3 % (ref 12.0–46.0)
Lymphs Abs: 1.8 10*3/uL (ref 0.7–4.0)
MCHC: 30.2 g/dL (ref 30.0–36.0)
MCV: 61.8 fl — ABNORMAL LOW (ref 78.0–100.0)
Monocytes Absolute: 0.4 10*3/uL (ref 0.1–1.0)
Monocytes Relative: 6.2 % (ref 3.0–12.0)
Neutro Abs: 3.3 10*3/uL (ref 1.4–7.7)
Neutrophils Relative %: 58.8 % (ref 43.0–77.0)
Platelets: 390 10*3/uL (ref 150.0–400.0)
RBC: 4.15 Mil/uL (ref 3.87–5.11)
RDW: 21.4 % — ABNORMAL HIGH (ref 11.5–15.5)
WBC: 5.7 10*3/uL (ref 4.0–10.5)

## 2022-12-25 LAB — IBC PANEL
Iron: 19 ug/dL — ABNORMAL LOW (ref 42–145)
Saturation Ratios: 4 % — ABNORMAL LOW (ref 20.0–50.0)
TIBC: 471.8 ug/dL — ABNORMAL HIGH (ref 250.0–450.0)
Transferrin: 337 mg/dL (ref 212.0–360.0)

## 2022-12-25 LAB — FERRITIN: Ferritin: 3.1 ng/mL — ABNORMAL LOW (ref 10.0–291.0)

## 2022-12-25 NOTE — Addendum Note (Signed)
Addended by: Suzzanne Cloud E on: 12/25/2022 10:02 AM   Modules accepted: Orders

## 2022-12-25 NOTE — Addendum Note (Signed)
Addended by: Irine Seal B on: 12/25/2022 10:18 AM   Modules accepted: Orders

## 2022-12-25 NOTE — Patient Instructions (Signed)
Iron deficiency anemia: Check ferritin and routine iron studies CBC Assuming these show iron deficiency I will arrange for iron infusion therapy in our infusion center  Epistaxis (nosebleeds) We will refer you back to ear nose and throat  Osler-Weber-Rendu syndrome: Keep planned studies at [facility] (bubble study and MRI brain)  Follow-up with me in 2 months

## 2022-12-25 NOTE — Progress Notes (Signed)
Synopsis: Referred in 10/2022 for wheezing and dyspnea after having COVID.  Diagnosed with Osler Weber Rendu syndrome in 11/2022, seen by Dr. Eston Esters for the same in 12/2022  Subjective:   PATIENT ID: Nichole Figueroa GENDER: female DOB: 1971-08-06, MRN: [id number]   HPI  Chief Complaint  Patient presents with   Follow-up    2 - 3 week Follow up. No new issues since LOV.    Nichole Figueroa has been doing okay since the last visit though she continues to have nosebleeds.  She saw Dr. Rozelle Logan at [facility] who agrees that she has Osler-Weber-Rendu syndrome.  She has not heard back from ear nose and throat.  She has an MRI of her brain as well as a bubble study pending at [facility].  Past Medical History:  Diagnosis Date   Kidney stone        Review of Systems  Constitutional:  Negative for chills, fever, malaise/fatigue and weight loss.  HENT:  Positive for nosebleeds. Negative for congestion, sinus pain and sore throat.   Respiratory:  Positive for shortness of breath. Negative for cough and sputum production.   Cardiovascular:  Negative for chest pain and leg swelling.      Objective:  Physical Exam   Vitals:   12/25/22 0923  BP: 112/68  Pulse: 74  Temp: 98.2 F (36.8 C)  TempSrc: Oral  SpO2: 98%  Weight: 197 lb (89.4 kg)  Height: _0  (1.626 m)    Gen: well appearing HENT: OP clear, neck supple PULM: CTA B, normal effort  CV: RRR, no mgr GI: BS+, soft, nontender Derm: teliangiectasias Psyche: normal mood and affect    CBC    Component Value Date/Time   WBC 6.3 10/23/2022 1534   RBC 4.28 10/23/2022 1534   HGB 8.2 Repeated and verified X2. (L) 10/23/2022 1534   HCT 26.9 (L) 10/23/2022 1534   PLT 413.0 (H) 10/23/2022 1534   MCV 62.9 (L) 10/23/2022 1534   MCH 18.6 (L) 11/18/2020 0602   MCHC 30.4 10/23/2022 1534   RDW 20.8 (H) 10/23/2022 1534   LYMPHSABS 1.8 08/03/2016 2029   MONOABS 0.4 08/03/2016 2029   EOSABS 0.1 08/03/2016 2029   BASOSABS 0.0  08/03/2016 2029    Chest imaging: October 2023 chest x-ray images independently reviewed showing increased AP diameter, flattening of the diaphragms, pulmonary nodule right upper lobe. November 2023 CT chest with contrast images reviewed showing a 3 mm solid pulmonary nodule right upper lobe, heterogeneous appearance of the liver, very mild patchy groundglass posterior, peripheral based  PFT: 12/11/2022 PFT> ratio 86%, FEV1 2.36 L 85% predicted, 32% change with bronchodilator, total lung capacity 4.02 L 79% predicted, DLCO 12.6 60% predicted  Labs: November 2023 hemoglobin 8.2 g/dL, MCV 62.9, ANA negative, double-stranded DNA negative, ANCA screen negative D-dimer slightly positive, TSH normal  Path:  Echo:  Heart Catheterization:       Assessment & Plan:   Osler-Weber-Rendu disease (HCC)  Epistaxis  Iron deficiency anemia due to chronic blood loss  Discussion: Stable interval, needs ENT evaluation.  Needs iron supplementation for iron deficiency anemia.  Plan: Iron deficiency anemia: Check ferritin and routine iron studies CBC Assuming these show iron deficiency I will arrange for iron infusion therapy in our infusion center  Epistaxis (nosebleeds) We will refer you back to ear nose and throat  Osler-Weber-Rendu syndrome: Keep planned studies at [facility] (bubble study and MRI brain  Follow-up with me in 2 months  Case discussed with Dr.  Huang at [facility].  Immunizations: Immunization History  Administered Date(s) Administered   DTaP 03/27/1970, 05/11/1970, 06/29/1970, 07/22/1971   Hepatitis B, PED/ADOLESCENT 12/11/2000, 01/14/2001, 07/28/2001   Hepatitis B, adult 03/11/2018   Influenza Split 10/25/2021   Influenza-Unspecified 10/08/2017, 10/22/2021   MMR 11/28/1970, 01/08/1971, 07/22/1971   OPV 08/16/1970, 10/29/1970, 07/22/1971, 07/28/1985   PFIZER(Purple Top)SARS-COV-2 Vaccination 02/24/2020, 03/21/2020   PNEUMOCOCCAL CONJUGATE-20 11/01/2021   PPD  Test 02/06/2012, 08/11/2017, 03/11/2018   Pfizer Covid-19 Vaccine Bivalent Booster 25yr & up 11/01/2021   Pneumococcal Polysaccharide-23 01/14/2001   Td 09/12/1999   Tdap 05/28/2020   Varicella 12/11/2000     Current Outpatient Medications:    albuterol (VENTOLIN HFA) 108 (90 Base) MCG/ACT inhaler, SMARTSIG:2 Puff(s) By Mouth Every 4 Hours PRN, Disp: , Rfl:    FLOVENT HFA 110 MCG/ACT inhaler, SMARTSIG:2 Puff(s) By Mouth Twice Daily, Disp: , Rfl:    Fluticasone-Umeclidin-Vilant (TRELEGY ELLIPTA) 100-62.5-25 MCG/ACT AEPB, Inhale 1 puff into the lungs daily., Disp: 1 each, Rfl: 0

## 2022-12-25 NOTE — Progress Notes (Signed)
Iron infusion orders written

## 2022-12-25 NOTE — Addendum Note (Signed)
Addended by: Suzzanne Cloud E on: 12/25/2022 10:05 AM   Modules accepted: Orders

## 2022-12-25 NOTE — Addendum Note (Signed)
Addended by: Suzzanne Cloud E on: 12/25/2022 10:20 AM   Modules accepted: Orders

## 2023-03-02 ENCOUNTER — Telehealth: Payer: Self-pay

## 2023-03-02 ENCOUNTER — Encounter: Payer: Self-pay | Admitting: Pulmonary Disease

## 2023-03-02 DIAGNOSIS — I872 Venous insufficiency (chronic) (peripheral): Secondary | ICD-10-CM

## 2023-03-02 NOTE — Telephone Encounter (Addendum)
Pt called c/o L leg tightness and a feeling that her "veins are going to burst".  Reviewed pt's chart, returned call for clarification, no answer, vm not setup.  Pt returned call, two identifiers used. Pt states that she's been wearing compression stockings and taking a baby aspirin. She states that she woke up and felt a tightness in her L thigh, no prior injury. Since her compressions are knee high, they do not cover her thigh. She denies redness or warmth. 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 Appts scheduled for [place] at [place] and [place] for 03/04/23. Pt aware of different locations and given addresses and phone numbers. Confirmed understanding.

## 2023-03-02 NOTE — Addendum Note (Signed)
Addended by: Dorita Sciara, Javin Nong A on: 03/02/2023 02:35 PM   Modules accepted: Orders

## 2023-03-04 ENCOUNTER — Ambulatory Visit: Payer: Medicaid Other | Admitting: Physician Assistant

## 2023-03-04 ENCOUNTER — Ambulatory Visit (HOSPITAL_COMMUNITY)
Admission: RE | Admit: 2023-03-04 | Discharge: 2023-03-04 | Disposition: A | Discharge status: Home / Self Care | Payer: Medicaid Other | Source: Ambulatory Visit | Attending: Cardiovascular Disease | Admitting: Cardiovascular Disease

## 2023-03-04 VITALS — BP 108/66 | HR 82 | Temp 97.8°F | Ht 64.0 in | Wt 198.0 lb

## 2023-03-04 DIAGNOSIS — M79605 Pain in left leg: Secondary | ICD-10-CM | POA: Diagnosis not present

## 2023-03-04 DIAGNOSIS — I872 Venous insufficiency (chronic) (peripheral): Secondary | ICD-10-CM | POA: Diagnosis not present

## 2023-03-04 NOTE — Progress Notes (Signed)
Office Note     CC:  follow up Requesting Provider:  Donald Prose, MD  HPI: Nichole Figueroa is a 52 y.o. (October 01, 1971) female who presents with three days of pains in her left thigh. Describes it like tightness, like something is going to "burst", and aching. Has also noticed some splotchy areas of redness. She says her symptoms are worse at night. She denies any injury or any change in her activity. Prior to this her legs were overall doing well. She does intermittently elevate and wear knee high compression. She reports recently being diagnosed with HHT in December of 2023 and has been having daily mild so severe nose bleeds. She is also Iron deficient and getting iron infusions so she is wondering if this is related.  Past Medical History:  Diagnosis Date   Kidney stone     Past Surgical History:  Procedure Laterality Date   ABDOMINAL HYSTERECTOMY N/A 2003    Social History   Socioeconomic History   Marital status: Widowed    Spouse name: Not on file   Number of children: Not on file   Years of education: Not on file   Highest education level: Not on file  Occupational History   Not on file  Tobacco Use   Smoking status: Never   Smokeless tobacco: Never  Vaping Use   Vaping Use: Never used  Substance and Sexual Activity   Alcohol use: No   Drug use: Not Currently   Sexual activity: Not on file  Other Topics Concern   Not on file  Social History Narrative   Not on file   Social Determinants of Health   Financial Resource Strain: Not on file  Food Insecurity: Not on file  Transportation Needs: Not on file  Physical Activity: Not on file  Stress: Not on file  Social Connections: Not on file  Intimate Partner Violence: Not on file    Family History  Problem Relation Age of Onset   Hypertension Mother    Osteoporosis Mother    Stroke Father     Current Outpatient Medications  Medication Sig Dispense Refill   albuterol (VENTOLIN HFA) 108 (90 Base) MCG/ACT  inhaler SMARTSIG:2 Puff(s) By Mouth Every 4 Hours PRN     FLOVENT HFA 110 MCG/ACT inhaler SMARTSIG:2 Puff(s) By Mouth Twice Daily     No current facility-administered medications for this visit.    Allergies  Allergen Reactions   Codeine Nausea And Vomiting   Hydrocodone Nausea And Vomiting   Hydrocodone-Acetaminophen     Other reaction(s): nausea   Percocet [Oxycodone-Acetaminophen] Itching and Nausea And Vomiting   Tramadol Hives     REVIEW OF SYSTEMS:   '[X]'$  denotes positive finding, '[ ]'$  denotes negative finding Cardiac  Comments:  Chest pain or chest pressure:    Shortness of breath upon exertion:    Short of breath when lying flat:    Irregular heart rhythm:        Vascular    Pain in calf, thigh, or hip brought on by ambulation:    Pain in feet at night that wakes you up from your sleep:     Blood clot in your veins:    Leg swelling:         Pulmonary    Oxygen at home:    Productive cough:     Wheezing:         Neurologic    Sudden weakness in arms or legs:     Sudden numbness  in arms or legs:     Sudden onset of difficulty speaking or slurred speech:    Temporary loss of vision in one eye:     Problems with dizziness:         Gastrointestinal    Blood in stool:     Vomited blood:         Genitourinary    Burning when urinating:     Blood in urine:        Psychiatric    Major depression:         Hematologic    Bleeding problems:    Problems with blood clotting too easily:        Skin    Rashes or ulcers:        Constitutional    Fever or chills:      PHYSICAL EXAMINATION:  Vitals:   03/04/23 1412  BP: 108/66  Pulse: 82  Temp: 97.8 F (36.6 C)  TempSrc: Temporal  SpO2: 99%  Weight: 198 lb (89.8 kg)  Height: '5\' 4"'$  (1.626 m)    General:  WDWN in NAD; vital signs documented above Gait: Normal HENT: WNL, normocephalic Pulmonary: normal non-labored breathing , without wheezing Cardiac: regular HR Extremities: without ischemic  changes, without Gangrene , without cellulitis; without open wounds; varicose veins and spider veins present of bilateral lower extremities. No appreciable edema. Doppler Dp/ Pt/ Peroneal triphasic signals bilaterally Musculoskeletal: no muscle wasting or atrophy  Neurologic: A&O X 3;  No focal weakness or paresthesias are detected Psychiatric:  The pt has Normal affect.   Non-Invasive Vascular Imaging:   LLE VAS [place] lower extremity venous reflux: Summary:  Left:  - No evidence of deep vein thrombosis seen in the left lower extremity, from the common femoral through the popliteal veins.  - No evidence of superficial venous thrombosis in the left lower extremity.  - There is no evidence of venous reflux seen in the left lower extremity.    ASSESSMENT/PLAN:: 52 y.o. female here for concerns of acute on set of mostly left thigh pain over past 3 days. She has not had any real aggravating or alleviating factors. No trauma or changes in activity to account for it. Her duplex today shows no DVT or SVT. Her veins are competent in her left lower extremity. On exam she also has triphasic doppler signals in both lower extremities so no concern for arterial disease bilaterally. Provided reassurance to patient that she does not have anything severe from a vascular standpoint that could explain her symptoms. She will follow up with her Hematologist on 03/10/23. She can follow up with [place] as needed if any new or concerning symptoms.    Karoline Caldwell, PA-C [facility] [phone]  Clinic MD:   [facility]

## 2023-03-31 ENCOUNTER — Encounter: Payer: Self-pay | Admitting: Internal Medicine

## 2023-03-31 ENCOUNTER — Ambulatory Visit: Payer: Medicaid Other | Admitting: Internal Medicine

## 2023-03-31 VITALS — BP 122/76 | HR 73 | Temp 97.6°F | Ht 64.0 in | Wt 201.4 lb

## 2023-03-31 DIAGNOSIS — J301 Allergic rhinitis due to pollen: Secondary | ICD-10-CM | POA: Diagnosis not present

## 2023-03-31 DIAGNOSIS — I780 Hereditary hemorrhagic telangiectasia: Secondary | ICD-10-CM | POA: Diagnosis not present

## 2023-03-31 DIAGNOSIS — J4530 Mild persistent asthma, uncomplicated: Secondary | ICD-10-CM | POA: Diagnosis not present

## 2023-03-31 MED ORDER — FLOVENT HFA 110 MCG/ACT IN AERO: 1.0000 | INHALATION_SPRAY | Freq: Two times a day (BID) | RESPIRATORY_TRACT | 5 refills | Status: AC

## 2023-03-31 MED ORDER — ALBUTEROL SULFATE HFA 108 (90 BASE) MCG/ACT IN AERS: 2.0000 | INHALATION_SPRAY | RESPIRATORY_TRACT | 5 refills | Status: AC | PRN

## 2023-03-31 MED ORDER — MONTELUKAST SODIUM 10 MG PO TABS: 10.0000 mg | ORAL_TABLET | Freq: Every day | ORAL | 11 refills | Status: AC

## 2023-03-31 NOTE — Progress Notes (Signed)
Nichole Figueroa    [id number]    03-26-1971  Primary Care Physician:White, Aram Beecham, MD Date of Appointment: 03/31/2023 Established Patient Visit  Chief complaint:   Chief Complaint  Patient presents with   Follow-up    Sob,      HPI: Nichole Figueroa is a 52 y.o. woman with history of HHT (Osler weber rendu syndrome) with manifestations of epistaxis and anemia requiring transfusions. She has had a CT PE scan which is negative for pulmonary AVM  Interval Updates: Here for follow up. She had a bevacizumab infusion for helping to control nosebleeds.  She saw Dr. Renaldo Reel at [facility] and was due to have echocardiogram with bubble study for microavms and also needs screening brain MRI for cerebral AVM. Those were ordered at [facility]. Her last hgb at [facility] was 11.1 last week. Ferritin 104.   She has had covid 5 times, last in October 2023. Since covid she has had ongoing dyspnea. Treated with paxlovid.  She has ongoing dyspnea since then with PFTs showing normal spirometry with significant bronchodilator response.  She was started on flovent inhaler in Jan 2024 for dyspnea for possible asthma. She doesn't feel the flovent is helping but she also hasn't been using regularly. Having coughing and wheezing. Now worsening with seasonal allergies.    She also has an albuterol inhaler which is helping her symptoms.   Has had allergy testing done in the past - multiple environmental allergens, ragweeds, mold, dust mites.   I have reviewed the patient's family social and past medical history and updated as appropriate.  Non-smoker but passive smoke exposure in childhood  Past Medical History:  Diagnosis Date   Kidney stone     Past Surgical History:  Procedure Laterality Date   ABDOMINAL HYSTERECTOMY N/A 2003    Family History  Problem Relation Age of Onset   Hypertension Mother    Osteoporosis Mother    Stroke Father     Social History   Occupational History   Not on file  Tobacco  Use   Smoking status: Never   Smokeless tobacco: Never  Vaping Use   Vaping Use: Never used  Substance and Sexual Activity   Alcohol use: No   Drug use: Not Currently   Sexual activity: Not on file     Physical Exam: Blood pressure 122/76, pulse 73, temperature 97.6 F (36.4 C), temperature source Oral, height 5\' 4"  (1.626 m), weight 201 lb 6.4 oz (91.4 kg), SpO2 100 %.  Gen:      No acute distress, multiple cutaneous AVMs ENT:  no blood, no nasal polyps, mucus membranes moist Lungs:    No increased respiratory effort, symmetric chest wall excursion, clear to auscultation bilaterally, no wheezes or crackles CV:         Regular rate and rhythm; no murmurs, rubs, or gallops.  No pedal edema   Data Reviewed: Imaging: I have personally reviewed the CTPE study done Dec 2023 - negative for PE. No parenchmyal abnormalities.   Echo Nov 2023 - normal LVEF.   PFTs:     Latest Ref Rng & Units 12/11/2022   11:57 AM  PFT Results  FVC-Pre L 2.28   FVC-Predicted Pre % 65   FVC-Post L 2.75   FVC-Predicted Post % 78   Pre FEV1/FVC % % 78   Post FEV1/FCV % % 86   FEV1-Pre L 1.78   FEV1-Predicted Pre % 64   FEV1-Post L 2.36  DLCO uncorrected ml/min/mmHg 12.63   DLCO UNC% % 60   DLCO corrected ml/min/mmHg 15.93   DLCO COR %Predicted % 76   DLVA Predicted % 92   TLC L 4.02   TLC % Predicted % 79   RV % Predicted % 70    I have personally reviewed the patient's PFTs and no airflow limitation, normal lung volumes. However significant response to bronchodilator.   Labs: Lab Results  Component Value Date   NA 139 11/18/2020   K 3.9 11/18/2020   CO2 23 11/18/2020   GLUCOSE 141 (H) 11/18/2020   BUN 19 11/18/2020   CREATININE 1.12 (H) 11/18/2020   CALCIUM 9.2 11/18/2020   GFRNONAA 60 (L) 11/18/2020   Lab Results  Component Value Date   WBC 5.7 12/25/2022   HGB 7.8 Repeated and verified X2. (LL) 12/25/2022   HCT 25.6 Repeated and verified X2. (L) 12/25/2022   MCV 61.8  Repeated and verified X2. (L) 12/25/2022   PLT 390.0 12/25/2022    Immunization status: Immunization History  Administered Date(s) Administered   DTaP 03/27/1970, 05/11/1970, 06/29/1970, 07/22/1971   Hepatitis B, ADULT 03/11/2018   Hepatitis B, PED/ADOLESCENT 12/11/2000, 01/14/2001, 07/28/2001   Influenza Split 10/25/2021   Influenza-Unspecified 10/08/2017, 10/22/2021   MMR 11/28/1970, 01/08/1971, 07/22/1971   OPV 08/16/1970, 10/29/1970, 07/22/1971, 07/28/1985   PFIZER(Purple Top)SARS-COV-2 Vaccination 02/24/2020, 03/21/2020   PNEUMOCOCCAL CONJUGATE-20 11/01/2021   PPD Test 02/06/2012, 08/11/2017, 03/11/2018   Pfizer Covid-19 Vaccine Bivalent Booster 79yrs & up 11/01/2021   Pneumococcal Polysaccharide-23 01/14/2001   Td 09/12/1999   Tdap 05/28/2020   Varicella 12/11/2000    External Records Personally Reviewed: hematology, pulmonary, Dr Kendrick Fries and Dr. Renaldo Reel at [facility].  Assessment:  HHT - OWR syndrome Mild persistent asthma, likely exacerbated by viral infection and passive smoke exposure Allergic rhinitis  Plan/Recommendations: Agree with Work up initiated by [facility] pulmonary. Will follow for results. If she does have delayed bubble study in transit - would consider ct angio to re-evaluate for pulmonary AVMs which may be missed on CTPE study.  Instructed to take flovent 1 puff BID for asthma, continue prn albuterol FeNO obtained today mildly elevated at 27 ppb Start montelukast for allergic rhinitis. Holding off on anti-histamines given their side effect of drying mucous passages in the setting of epistaxis. Hold off on nasal sprays as well.    Return to Care: Return in about 3 months (around 06/30/2023).   Durel Salts, MD Pulmonary and Critical Care Medicine [facility] Office:[phone]

## 2023-03-31 NOTE — Patient Instructions (Addendum)
Please schedule follow up scheduled with myself in 3 months.  If my schedule is not open yet, we will contact you with a reminder closer to that time. Please call [phone] if you haven't heard from [place] a month before.   Start taking flovent 1 puff twice a day, gargle after use Continue albuterol inhaler. Start taking montelukast (singulair) for allergies and asthma.  Agree with work up you are getting at [facility] for the HHT.

## 2023-07-30 ENCOUNTER — Ambulatory Visit (HOSPITAL_COMMUNITY)
Admission: RE | Admit: 2023-07-30 | Discharge: 2023-07-30 | Disposition: A | Discharge status: Home / Self Care | Payer: Medicaid Other | Source: Ambulatory Visit | Attending: Surgery | Admitting: Surgery

## 2023-07-30 ENCOUNTER — Other Ambulatory Visit (HOSPITAL_COMMUNITY): Payer: Self-pay | Admitting: Family Medicine

## 2023-07-30 DIAGNOSIS — M79605 Pain in left leg: Secondary | ICD-10-CM

## 2023-08-26 ENCOUNTER — Other Ambulatory Visit: Payer: Self-pay | Admitting: Family Medicine

## 2023-08-26 DIAGNOSIS — Z1231 Encounter for screening mammogram for malignant neoplasm of breast: Secondary | ICD-10-CM

## 2023-09-17 ENCOUNTER — Ambulatory Visit: Payer: Medicaid Other

## 2024-03-04 ENCOUNTER — Other Ambulatory Visit: Payer: Self-pay | Admitting: Urology

## 2024-03-04 DIAGNOSIS — N281 Cyst of kidney, acquired: Secondary | ICD-10-CM

## 2024-03-31 ENCOUNTER — Other Ambulatory Visit

## 2024-08-01 ENCOUNTER — Encounter: Payer: Self-pay | Admitting: Urology

## 2024-08-11 ENCOUNTER — Other Ambulatory Visit
